# Patient Record
Sex: Male | Born: 1990 | Hispanic: No | Marital: Single | Smoking: Former smoker
Health system: Southern US, Community
[De-identification: ages and names within clinical notes are randomized; demographics above are authoritative.]

## PROBLEM LIST (undated history)

## (undated) DIAGNOSIS — Z8719 Personal history of other diseases of the digestive system: Secondary | ICD-10-CM

## (undated) DIAGNOSIS — N200 Calculus of kidney: Secondary | ICD-10-CM

## (undated) HISTORY — DX: Calculus of kidney: N20.0

## (undated) HISTORY — DX: Personal history of other diseases of the digestive system: Z87.19

---
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
## 2009-08-06 ENCOUNTER — Ambulatory Visit (HOSPITAL_BASED_OUTPATIENT_CLINIC_OR_DEPARTMENT_OTHER): Admission: RE | Admit: 2009-08-06 | Discharge: 2009-08-06 | Payer: Self-pay | Admitting: Specialist

## 2010-03-28 LAB — POCT HEMOGLOBIN-HEMACUE: Hemoglobin: 10.8 g/dL — ABNORMAL LOW (ref 13.0–17.0)

## 2010-04-21 LAB — COMPREHENSIVE METABOLIC PANEL
AST: 25 U/L (ref 0–37)
Albumin: 4 g/dL (ref 3.5–5.2)
BUN: 9 mg/dL (ref 6–23)
Calcium: 9.9 mg/dL (ref 8.4–10.5)
Creatinine, Ser: 0.82 mg/dL (ref 0.4–1.5)
GFR calc Af Amer: >60 mL/min (ref >60)
Total Bilirubin: 1.3 mg/dL — ABNORMAL HIGH (ref 0.3–1.2)

## 2010-04-21 LAB — DIFFERENTIAL
Basophils Absolute: 0.1 10*3/uL (ref 0.0–0.1)
Eosinophils Relative: 2 % (ref 0–5)
Lymphocytes Relative: 14 % (ref 12–46)
Lymphs Abs: 1.7 10*3/uL (ref 0.7–4.0)
Monocytes Absolute: 0.8 10*3/uL (ref 0.1–1.0)
Neutro Abs: 9.8 10*3/uL — ABNORMAL HIGH (ref 1.7–7.7)

## 2010-04-21 LAB — CBC
HCT: 45.1 % (ref 39.0–52.0)
MCHC: 34.2 g/dL (ref 30.0–36.0)
MCV: 86.3 fL (ref 78.0–100.0)
Platelets: 261 10*3/uL (ref 150–400)
RDW: 14.4 % (ref 11.5–15.5)

## 2010-04-21 LAB — TYPE AND SCREEN: Antibody Screen: NEGATIVE

## 2010-04-21 LAB — ABO/RH: ABO/RH(D): A POS

## 2010-04-21 LAB — LIPASE, BLOOD: Lipase: 20 U/L (ref 11–59)

## 2010-05-26 NOTE — Op Note (Signed)
NAMEBRYAN, GOIN                 ACCOUNT NO.:  [id number]   MEDICAL RECORD NO.:  [id number]          PATIENT TYPE:  AMB   LOCATION:  DSC                          FACILITY:  [facility]   PHYSICIAN:  Christopher E. Ezzard Standing, M.D.DATE OF BIRTH:  October 26, 1990   DATE OF PROCEDURE:  08/19/2006  DATE OF DISCHARGE:                               OPERATIVE REPORT   PREOPERATIVE DIAGNOSES:  1. Adenoid tonsillar urgency with obstructive symptoms.  2. History of recurrent tonsillitis.   POSTOPERATIVE DIAGNOSES:  1. Adenoid tonsillar urgency with obstructive symptoms.  2. History of recurrent tonsillitis.   OPERATION:  Tonsillectomy and adenoidectomy.   SURGEON:  Dr. Narda Bonds.   ANESTHESIA:  General endotracheal.   COMPLICATIONS:  None.   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  He is taken to the operating room this time for tonsillectomy  and adenoidectomy.   DESCRIPTION OF PROCEDURE:  After adequate endotracheal anesthesia,  Zayan received 1 gram of Ancef IV preoperatively as well as 10 mg of  Decadron.  A mouth gag was used to expose the oropharynx.  The left and  right tonsils were then resected from the tonsillar fossa using the  cautery.  Care was taken to preserve the anterior and posterior  tonsillar pillars as well as the uvula.  Hemostasis was obtained with  the cautery.  Following this, a red rubber catheter was passed through  the nose and out the mouth to retract the soft palate.  The  nasopharynx  was examined.  Joshus had just a minimal amount of adenoid tissue.  Suction cautery was used to cauterize the central pad of adenoid tissue.  This completed the procedure.  The nose and nasopharynx was irrigated  with saline.  Caylor was awoke from anesthesia and transferred to the  recovery room postop doing well.   DISPOSITION:  Eber was discharged home later this  morning on  amoxicillin suspension 500 mg b.i.d. for 1 week, Tylenol and Lortab  elixir 15 mL q.4 h p.r.n. pain. We will have him follow-up in my office  in 10 days for recheck.          ______________________________  Kristine Garbe. Ezzard Standing, M.D.    CEN/MEDQ  D:  08/19/2006  T:  08/19/2006  Job:  [id number]   cc:   Windle Guard, M.D.
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
## 2010-10-25 ENCOUNTER — Other Ambulatory Visit (INDEPENDENT_AMBULATORY_CARE_PROVIDER_SITE_OTHER): Payer: Self-pay | Admitting: General Surgery

## 2010-10-25 ENCOUNTER — Emergency Department (HOSPITAL_COMMUNITY): Payer: Managed Care, Other (non HMO)

## 2010-10-25 ENCOUNTER — Observation Stay (HOSPITAL_COMMUNITY)
Admission: EM | Admit: 2010-10-25 | Discharge: 2010-10-26 | Disposition: A | Discharge status: Home / Self Care | Payer: Managed Care, Other (non HMO) | Attending: Surgery | Admitting: Surgery

## 2010-10-25 DIAGNOSIS — E669 Obesity, unspecified: Secondary | ICD-10-CM | POA: Insufficient documentation

## 2010-10-25 DIAGNOSIS — F3289 Other specified depressive episodes: Secondary | ICD-10-CM | POA: Insufficient documentation

## 2010-10-25 DIAGNOSIS — F411 Generalized anxiety disorder: Secondary | ICD-10-CM | POA: Insufficient documentation

## 2010-10-25 DIAGNOSIS — R1031 Right lower quadrant pain: Secondary | ICD-10-CM

## 2010-10-25 DIAGNOSIS — K3580 Unspecified acute appendicitis: Secondary | ICD-10-CM

## 2010-10-25 DIAGNOSIS — F329 Major depressive disorder, single episode, unspecified: Secondary | ICD-10-CM | POA: Insufficient documentation

## 2010-10-25 DIAGNOSIS — R110 Nausea: Secondary | ICD-10-CM

## 2010-10-25 HISTORY — PX: APPENDECTOMY: SHX54

## 2010-10-25 LAB — BASIC METABOLIC PANEL
BUN: 15 mg/dL (ref 6–23)
Calcium: 9.4 mg/dL (ref 8.4–10.5)
Chloride: 102 mEq/L (ref 96–112)
Creatinine, Ser: 0.68 mg/dL (ref 0.50–1.35)
Glucose, Bld: 98 mg/dL (ref 70–99)
Potassium: 4.1 mEq/L (ref 3.5–5.1)
Sodium: 138 mEq/L (ref 135–145)

## 2010-10-25 LAB — DIFFERENTIAL
Basophils Absolute: 0.1 10*3/uL (ref 0.0–0.1)
Basophils Relative: 0 % (ref 0–1)
Eosinophils Relative: 4 % (ref 0–5)
Lymphocytes Relative: 22 % (ref 12–46)
Monocytes Absolute: 1.3 10*3/uL — ABNORMAL HIGH (ref 0.1–1.0)
Neutro Abs: 9.3 10*3/uL — ABNORMAL HIGH (ref 1.7–7.7)

## 2010-10-25 LAB — CBC
MCHC: 36.1 g/dL — ABNORMAL HIGH (ref 30.0–36.0)
Platelets: 249 10*3/uL (ref 150–400)
RDW: 13.3 % (ref 11.5–15.5)
WBC: 14.2 10*3/uL — ABNORMAL HIGH (ref 4.0–10.5)

## 2010-10-25 MED ORDER — IOHEXOL 300 MG/ML  SOLN
100.0000 mL | Freq: Once | INTRAMUSCULAR | Status: AC | PRN
  Administered 2010-10-25: 100 mL via INTRAVENOUS

## 2010-10-30 ENCOUNTER — Telehealth (INDEPENDENT_AMBULATORY_CARE_PROVIDER_SITE_OTHER): Payer: Self-pay

## 2010-10-30 DIAGNOSIS — Z9049 Acquired absence of other specified parts of digestive tract: Secondary | ICD-10-CM

## 2010-10-30 MED ORDER — HYDROCODONE-ACETAMINOPHEN 5-325 MG PO TABS: 1.0000 | ORAL_TABLET | Freq: Four times a day (QID) | ORAL | Status: AC | PRN

## 2010-10-30 NOTE — Telephone Encounter (Signed)
Pt requesting refill on Oxycodone. I advised the pt that I could call in the automatic RF for Vicodin. The pt uses the [facility] on [address] [facility]

## 2010-11-10 ENCOUNTER — Ambulatory Visit (INDEPENDENT_AMBULATORY_CARE_PROVIDER_SITE_OTHER): Payer: Managed Care, Other (non HMO) | Admitting: General Surgery

## 2010-11-10 ENCOUNTER — Encounter (INDEPENDENT_AMBULATORY_CARE_PROVIDER_SITE_OTHER): Payer: Self-pay | Admitting: General Surgery

## 2010-11-10 VITALS — BP 128/96 | HR 70 | Temp 96.6°F | Resp 20 | Ht 71.0 in | Wt 286.2 lb

## 2010-11-10 DIAGNOSIS — Z9889 Other specified postprocedural states: Secondary | ICD-10-CM

## 2010-11-10 DIAGNOSIS — K3580 Unspecified acute appendicitis: Secondary | ICD-10-CM

## 2010-11-10 NOTE — Patient Instructions (Signed)
Return as needed

## 2010-11-10 NOTE — Progress Notes (Signed)
Loreto L Tate 08/11/1990 [id number] 11/10/2010   History of Present Illness: OLE LAFON is a  20 y.o. male who presents today status post lap appy.  Pathology reveals acute appendicitis.  The patient is tolerating a regular diet, having normal bowel movements, has good pain control.  He  is back to most normal activities.   Physical Exam: Abd: soft, nontender, active bowel sounds, nondistended.  All incisions are well healed.  Impression: 1.  Acute appendicitis, s/p lap appy  Plan: He  is able to return to normal activities. He  may follow up on a prn basis.

## 2010-11-13 NOTE — Op Note (Signed)
NAMEOLAN, KUREK NO.:  [id number]  MEDICAL RECORD NO.:  [id number]  LOCATION:  5121                         FACILITY:  [facility]  PHYSICIAN:  Mary Sella. Andrey Campanile, MD     DATE OF BIRTH:  09-10-1990  DATE OF PROCEDURE:  10/25/2010 DATE OF DISCHARGE:                              OPERATIVE REPORT   PREOP DIAGNOSIS:  Acute appendicitis.  POSTOP DIAGNOSIS:  Acute appendicitis.  PROCEDURE:  Laparoscopic appendectomy.  SURGEON:  Mary Sella. Andrey Campanile, MD  ASSISTANT SURGEON:  None.  ANESTHESIA:  General plus local consisting 0.25% Marcaine with epi.  FINDINGS:  The appendix was acutely inflamed and thickened, but nonperforated.  SPECIMEN:  Appendix.  ESTIMATED BLOOD LOSS:  Minimal.  DRAINS:  None.  INDICATIONS FOR PROCEDURE:  The patient is a 20 year old obese Caucasian male who developed abdominal pain yesterday evening while at work.  The pain persisted and became localized in his right lower quadrant.  It is associated with nausea.  The pain worsened and prompted him to come to the emergency room for evaluation.  CT scan was performed, which demonstrated acute appendicitis.  His physical exam was also consistent with acute appendicitis.  We discussed the etiology and management of acute appendicitis.  We discussed nonoperative management such as a laparoscopic appendectomy versus IV antibiotics alone.  My formal recommendation was a laparoscopic appendectomy, possible open.  We discussed the risks and benefits of surgery including but not limited to bleeding, infection, injury to surrounding structures, need to convert to an open procedure, blood clot formation, ileus, staple line complications such as bleeding or that staple line leak, ileus formation, incisional hernia as well as general anesthesia complications.  The patient elected to proceed to the operating room.  I did inform him that he would have significant improvement in his symptoms with a  laparoscopic appendectomy.  DESCRIPTION OF PROCEDURE:  After obtaining an informed consent, the patient was brought to the operating room and placed supine on the operating table.  General endotracheal anesthesia was established. Sequential compression devices were placed.  His abdomen was prepped and draped in usual standard surgical fashion.  He received Invanz prior to skin incision.  Surgical time-out was performed.  His abdomen is prepped and draped in usual standard surgical fashion with ChloraPrep.  Local was infiltrated at the base of the umbilicus.  A 1.5 cm vertical infraumbilical incision was made with a #11 blade. The fascia was grasped and lifted anteriorly.  Next, the fascia was incised with a #11 blade and a pursestring suture consisting of 0 Vicryl was placed around the fascial edges.  Next, a 12-mm Hasson trocar was placed and pneumoperitoneum was smoothly established up to a patient pressure of 15 mmHg.  The laparoscope was advanced, there is no evidence of injury to surrounding structures.  The patient was placed in Trendelenburg and rotated to the left.  Two 5 mm trocars were placed, one in the left lower quadrant, one in the suprapubic area, all under direct visualization after local had been infiltrated.  I lifted the omentum out side of the pelvis.  The appendix was readily visible.  It was adhered to the right pelvic  sidewall.  It was inflamed and thickened, and the mesoappendix was inflamed and thickened as well.  I lifted up on the mesoappendix and identified the terminal ileum in the appendiceal base and the cecum.  Then using the Harmonic Scalpel, I took down the mesoappendix in a serial fashion to the base of the appendix taking great care not to injure the surrounding terminal ileum or the cecum.  Once the base was completely mobilized, I took laparoscopic articulating 45 mm stapler with a white load and stapled across the appendiceal base incorporating  some of the cecum.  There is about a 2-mm stalk left of the intestine, therefore I fired another load across that to completely transect the staple line.  The appendix was placed in the specimen bag, which was had been placed through the umbilical trocar.  I then extracted the specimen bag with the appendix and mesoappendix.  The mesoappendix was very thickened and the specimen bag did not come readily out of the abdomen.  I got part of it out and part of it still remained in intraabdominal.  I used a Kelly to pry open the fascial incision a little bit longer, but still it did not work.  The bag actually started to tear a little bit, therefore I incised the fascial incision slightly with an #11 blade.  I then was able to extract the specimen bag.  The entire appendix was removed in its entirety.  I then replaced the Hasson trocar.  I irrigated the right lower quadrant with a liter and half of saline.  There is no evidence of staple line leak. There is one spot of the staple line that was oozing.  I took the hook electrocautery and turned the cautery down to 15 and just gently tapped it with cautery, hemostasis was achieved.  I then evacuated the remaining irrigation fluid return the patient to the midline position and removed the Hasson trocar.  I tied down the previously placed pursestring suture to bring the fascial edges together.  There was still a small little gap, therefore I placed another single interrupted 0 Vicryl suture thus obliterating the fascial defect.  There was nothing within our closure when viewed laparoscopically.  Pneumoperitoneum was released.  I then irrigated the infraumbilical incision with saline since the bag had ripped slightly.  I then closed the skin incisions with 4-0 Monocryl in a subcuticular fashion followed by the application of Dermabond.  The Foley catheter was removed.  All needle, instrument and sponge counts were correct x2.  There are no  immediate complications.  The patient tolerated the procedure well.     Mary Sella. Andrey Campanile, MD     EMW/MEDQ  D:  10/25/2010  T:  10/26/2010  Job:  [id number]  Electronically Signed by Gaynelle Adu M.D. on 11/13/2010 10:22:07 AM

## 2010-11-13 NOTE — H&P (Signed)
Derek Farrell, Derek Farrell NO.:  [id number]  MEDICAL RECORD NO.:  [id number]  LOCATION:  [facility]                         FACILITY:  [facility]  PHYSICIAN:  Mary Sella. Andrey Campanile, MD     DATE OF BIRTH:  December 04, 1990  DATE OF ADMISSION:  10/25/2010 DATE OF DISCHARGE:                             HISTORY & PHYSICAL   ADMITTING SERVICE:  [facility].  CHIEF COMPLAINT:  Abdominal pain.  REASON FOR ADMISSION:  Acute appendicitis.  HISTORY OF PRESENT ILLNESS:  This is a 20 year old obese Caucasian male who developed periumbilical pain yesterday evening while at work.  The pain was initially around his umbilicus and on the left and right side. He had never had any pain like that before.  He was able to finish shift and went home.  Throughout the evening and the night the pain worsened and he actually woke early this morning with worsening pain now in his right lower quadrant.  The pain is constant and sharp.  If he is lying still it does not really bother him that much.  With any movement will aggravate the discomfort.  It has been associated with nausea.  He specifically denies fevers, chills, vomiting, diarrhea, and constipation.  His last bowel movement yesterday evening.  He denies any anorexia.  His last meal was yesterday evening at around 11 p.m.  He denies any melena, hematochezia, or dysuria.  He denies any unplanned weight change.  PAST MEDICAL HISTORY: 1. Anxiety 2. History of major depression and dissociative disorder. 3. ADHD.  PAST SURGICAL HISTORY: 1. Right shoulder arthroscopy 2,  Tonsils and adenoidectomy in 2008.  MEDICATIONS:  Prozac, Abilify, and Diovan.  ALLERGIES:  No known drug allergies.  SOCIAL HISTORY:  Denies any alcohol, drugs, or tobacco.  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  He lives with his grandmother.  REVIEW OF SYSTEMS:  A comprehensive 12-point review of systems was performed, all systems are negative except what is  mentioned above.  FAMILY HISTORY:  Hypertension and obesity.  PHYSICAL EXAMINATION:  VITAL SIGNS:  Temperature 97.8, heart rate 79, blood pressure 140/76, respirations 18, and satting 98% on room air. GENERAL:  Well-developed, well-nourished, obese Caucasian male in no apparent distress. HEENT:  Atraumatic and normocephalic.  Pupils are equal and round.  No scleral icterus.  No external ear lesions.  Hearing grossly normal. NECK:  Supple.  No lymphadenopathy.  Trachea is midline.  PULMONARY: Lungs clear and symmetric. CHEST:  No accessory use of muscles. CARDIOVASCULAR:  Regular rate and rhythm.  2+ radial pulse.  ABDOMEN: Obese, soft.  Positive bowel sounds.  No scars.  He is tender to palpation in his right side and right lower quadrant.  There is no guarding.  There is no rebound tenderness.  No hernias. SKIN:  No jaundice.  No rash.  No edema. NEURO:  Nonfocal.  Sensation grossly intact. PSYCHIATRIC:  Alert and oriented.  Judgment and insight appear appropriate. MUSCULOSKELETAL:  Moves all extremities.  Strength is symmetric.  LABORATORY:  Sodium 138, potassium 4.1, chloride 102, bicarb 25, BUN 15, creatinine 0.68, blood sugar 98, calcium 9.4, white blood cell count 14.2, hemoglobin 16, hematocrit  44, and platelet count 249.  RADIOGRAPHS:  I personally reviewed the CAT scan in addition to reviewing the final report of a CT abdomen and pelvis which showed a dilated appendix with some periappendiceal fat stranding.  There is no evidence of abscess or free air.  There was some small amount of free fluid in the pelvis.  IMPRESSION:  A 20 year old Caucasian male with; 1. Obesity. 2. Attention deficit-hyperactivity disorder. 3. Anxiety. 4. History of major depression. 5. Acute appendicitis.  PLAN:  I have recommended admission to the hospital for his acute appendicitis.  We are going to make him continued to keep him n.p.o. and start IV fluids.  We are going to start  broad-spectrum IV antibiotics. Sequential compression devices will be placed.  We will take him to the operating room later this morning for a laparoscopic appendectomy.  We did discuss the etiology of appendicitis and his management including operative and nonoperative management, we discussed alternative treatments such as IV antibiotics alone versus surgical removal of the appendix.  The patient has elected to proceed to the operating room for laparoscopic appendectomy.  We will continue his home medications.     Mary Sella. Andrey Campanile, MD     EMW/MEDQ  D:  10/25/2010  T:  10/25/2010  Job:  [id number]  Electronically Signed by Gaynelle Adu M.D. on 11/13/2010 10:21:00 AM

## 2011-06-27 ENCOUNTER — Emergency Department (HOSPITAL_COMMUNITY)
Admission: EM | Admit: 2011-06-27 | Discharge: 2011-06-27 | Disposition: A | Discharge status: Home / Self Care | Payer: Managed Care, Other (non HMO) | Attending: Emergency Medicine | Admitting: Emergency Medicine

## 2011-06-27 ENCOUNTER — Encounter (HOSPITAL_COMMUNITY): Payer: Self-pay | Admitting: *Deleted

## 2011-06-27 DIAGNOSIS — F17200 Nicotine dependence, unspecified, uncomplicated: Secondary | ICD-10-CM | POA: Insufficient documentation

## 2011-06-27 DIAGNOSIS — S0180XA Unspecified open wound of other part of head, initial encounter: Secondary | ICD-10-CM | POA: Insufficient documentation

## 2011-06-27 DIAGNOSIS — W1809XA Striking against other object with subsequent fall, initial encounter: Secondary | ICD-10-CM | POA: Insufficient documentation

## 2011-06-27 DIAGNOSIS — S0181XA Laceration without foreign body of other part of head, initial encounter: Secondary | ICD-10-CM

## 2011-06-27 DIAGNOSIS — Y9311 Activity, swimming: Secondary | ICD-10-CM | POA: Insufficient documentation

## 2011-06-27 NOTE — Discharge Instructions (Signed)
Facial Laceration A facial laceration is a cut on the face. It can take 1 to 2 years for the scar to heal completely. HOME CARE  For stitches (sutures):  Keep the cut clean and dry.   If you have a bandage (dressing), change it at least once a day. Change the bandage if it gets wet or dirty, or as told by your doctor.   Wash the cut with soap and water 2 times a day. Rinse the cut with water. Pat it dry with a clean towel.   Put a thin layer of medicated cream on the cut as told by your doctor.   You may shower after the first 24 hours. Do not soak the cut in water until the stitches are removed.   Only take medicines as told by your doctor.   Have your stitches removed as told by your doctor.   Do not wear makeup until a few days after your stitches are removed.  For skin adhesive strips:  Keep the cut clean and dry.   Do not get the strips wet. You may take a bath, but be careful to keep the cut dry.   If the cut gets wet, pat it dry with a clean towel.   The strips will fall off on their own. Do not remove the strips that are still stuck to the cut.  For wound glue:  You may shower or take baths. Do not soak or scrub the cut. Do not swim. Avoid heavy sweating until the glue falls off on its own. After a shower or bath, pat the cut dry with a clean towel.   Do not put medicine or makeup on your cut until the glue falls off.   If you have a bandage, do not put tape over the glue.   Avoid lots of sunlight or tanning lamps until the glue falls off. Put sunscreen on the cut for the first year to reduce your scar.   The glue will fall off on its own. Do not pick at the glue.  You may need a tetanus shot if:  You cannot remember when you had your last tetanus shot.   You have never had a tetanus shot.  If you need a tetanus shot and you choose not to have one, you may get tetanus. Sickness from tetanus can be serious. GET HELP RIGHT AWAY IF:   Your cut gets red, painful,  or puffy (swollen).   There is yellowish-white fluid (pus) coming from the cut.   You have chills or a fever.  MAKE SURE YOU:   Understand these instructions.   Will watch your condition.   Will get help right away if you are not doing well or get worse.  Document Released: 06/16/2007 Document Revised: 12/17/2010 Document Reviewed: 06/23/2010 ExitCare Patient Information 2012 [employer]. 

## 2011-06-27 NOTE — ED Notes (Signed)
Pt states he hit right side of head last night, denies LOC, pt with small laceration above right eyebrow, is here to see if he needs stitches. Denies any headache or blurred vision.

## 2011-06-27 NOTE — ED Provider Notes (Signed)
History  This chart was scribed for Derek Sprout, MD by Derek Farrell. The patient was seen in room STRE4/STRE4. Patient's care was started at 1227.  CSN: [id number]  Arrival date & time 06/27/11  1227   First MD Initiated Contact with Patient 06/27/11 1320      Chief Complaint  Patient presents with  . Facial Injury    (Consider location/radiation/quality/duration/timing/severity/associated sxs/prior treatment) The history is provided by the patient.    Derek Farrell is a 21 y.o. male who presents to the Emergency Department complaining of a small laceration above his right eyelid that occurred 14 hours ago. Pt states that he was swimming with some friends last night when he slipped and hit his head on the ladder. Bleeding has been controlled. Pt reports that his last tetanus shot was 2-3 years ago. Pt denies HA, blurred vision, and syncope.    History reviewed. No pertinent past medical history.  Past Surgical History  Procedure Date  . Appendectomy 10/25/10    History reviewed. No pertinent family history.  History  Substance Use Topics  . Smoking status: Current Some Day Smoker  . Smokeless tobacco: Never Used  . Alcohol Use: Yes      Review of Systems  Constitutional: Negative for fever and chills.  Eyes: Negative for pain and visual disturbance.  Respiratory: Negative for shortness of breath.   Gastrointestinal: Negative for nausea and vomiting.  Skin: Positive for wound (cut above right eye).  Neurological: Negative for syncope, weakness and headaches.  All other systems reviewed and are negative.    Allergies  Review of patient's allergies indicates no known allergies.  Home Medications   Current Outpatient Rx  Name Route Sig Dispense Refill  . FLUOXETINE HCL 20 MG PO CAPS Oral Take 20 mg by mouth.     Marland Kitchen RISPERIDONE 0.5 MG PO TABS Oral Take 0.5 mg by mouth daily.      BP 139/87  Pulse 108  Temp 97.9 F (36.6 C) (Oral)  Resp 17  SpO2  100%  Physical Exam  Nursing note and vitals reviewed. Constitutional: He is oriented to person, place, and time. He appears well-developed and well-nourished. No distress.  HENT:  Head: Normocephalic and atraumatic.  Eyes: EOM are normal. No scleral icterus.  Neck: Neck supple. No tracheal deviation present.  Musculoskeletal: Normal range of motion.  Neurological: He is alert and oriented to person, place, and time.  Skin: Skin is warm and dry.       Curved laceration at lateral aspect of right eyebrow. In first stages of healing and cannot be pulled apart, no orbital tenderness.   Psychiatric: He has a normal mood and affect. His behavior is normal.    ED Course  Procedures (including critical care time)  DIAGNOSTIC STUDIES: Oxygen Saturation is 100% on room air, normal by my interpretation.    COORDINATION OF CARE: 1:45PM - Advised patient that it has already begun healing, and does not need stitches. Pt agrees.     Labs Reviewed - No data to display No results found.   1. Facial laceration       MDM   Patient with a laceration to his face it occurred over 12 hours ago. He was coming into evaluated. He denies any LOC is otherwise feeling normally. Patient's tetanus shot is up-to-date. At this point in time the wound is starting to heal and do not feel he would be of benefit for him to try to. At this time.  I personally performed the services described in this documentation, which was scribed in my presence.  The recorded information has been reviewed and considered.        Derek Sprout, MD 06/27/11 1630

## 2013-07-15 ENCOUNTER — Encounter (HOSPITAL_COMMUNITY): Payer: Self-pay | Admitting: Emergency Medicine

## 2013-07-15 ENCOUNTER — Emergency Department (INDEPENDENT_AMBULATORY_CARE_PROVIDER_SITE_OTHER)
Admission: EM | Admit: 2013-07-15 | Discharge: 2013-07-15 | Disposition: A | Discharge status: Home / Self Care | Payer: BC Managed Care – PPO | Source: Home / Self Care

## 2013-07-15 DIAGNOSIS — J45901 Unspecified asthma with (acute) exacerbation: Secondary | ICD-10-CM

## 2013-07-15 MED ORDER — PREDNISONE 20 MG PO TABS: 60.0000 mg | ORAL_TABLET | Freq: Every day | ORAL | Status: DC

## 2013-07-15 MED ORDER — IPRATROPIUM BROMIDE 0.02 % IN SOLN
RESPIRATORY_TRACT | Status: AC
  Filled 2013-07-15: qty 2.5

## 2013-07-15 MED ORDER — CETIRIZINE-PSEUDOEPHEDRINE ER 5-120 MG PO TB12: 1.0000 | ORAL_TABLET | Freq: Every day | ORAL | Status: DC

## 2013-07-15 MED ORDER — IPRATROPIUM BROMIDE 0.02 % IN SOLN
0.5000 mg | Freq: Once | RESPIRATORY_TRACT | Status: AC
  Administered 2013-07-15: 0.5 mg via RESPIRATORY_TRACT

## 2013-07-15 MED ORDER — ALBUTEROL SULFATE (2.5 MG/3ML) 0.083% IN NEBU
5.0000 mg | INHALATION_SOLUTION | Freq: Once | RESPIRATORY_TRACT | Status: AC
  Administered 2013-07-15: 5 mg via RESPIRATORY_TRACT

## 2013-07-15 MED ORDER — ALBUTEROL SULFATE (2.5 MG/3ML) 0.083% IN NEBU
INHALATION_SOLUTION | RESPIRATORY_TRACT | Status: AC
  Filled 2013-07-15: qty 6

## 2013-07-15 MED ORDER — ALBUTEROL SULFATE HFA 108 (90 BASE) MCG/ACT IN AERS: 1.0000 | INHALATION_SPRAY | Freq: Four times a day (QID) | RESPIRATORY_TRACT | Status: DC | PRN

## 2013-07-15 MED ORDER — FLUTICASONE PROPIONATE 50 MCG/ACT NA SUSP: 2.0000 | Freq: Every day | NASAL | Status: DC

## 2013-07-15 NOTE — Discharge Instructions (Signed)
Asthma, Adult  Asthma is a condition of the lungs in which the airways tighten and narrow. Asthma can make it hard to breathe. Asthma cannot be cured, but medicine and lifestyle changes can help control it. Asthma may be started (triggered) by:  · Animal skin flakes (dander).  · Dust.  · Cockroaches.  · Pollen.  · Mold.  · Smoke.  · Cleaning products.  · Hair sprays or aerosol sprays.  · Paint fumes or strong smells.  · Cold air, weather changes, and winds.  · Crying or laughing hard.  · Stress.  · Certain medicines or drugs.  · Foods, such as dried fruit, potato chips, and sparkling grape juice.  · Infections or conditions (colds, flu).  · Exercise.  · Certain medical conditions or diseases.  · Exercise or tiring activities.  HOME CARE   · Take medicine as told by your doctor.  · Use a peak flow meter as told by your doctor. A peak flow meter is a tool that measures how well the lungs are working.  · Record and keep track of the peak flow meter's readings.  · Understand and use the asthma action plan. An asthma action plan is a written plan for taking care of your asthma and treating your attacks.  · To help prevent asthma attacks:  ¨ Do not smoke. Stay away from secondhand smoke.  ¨ Change your heating and air conditioning filter often.  ¨ Limit your use of fireplaces and wood stoves.  ¨ Get rid of pests (such as roaches and mice) and their droppings.  ¨ Throw away plants if you see mold on them.  ¨ Clean your floors. Dust regularly. Use cleaning products that do not smell.  ¨ Have someone vacuum when you are not home. Use a vacuum cleaner with a HEPA filter if possible.  ¨ Replace carpet with wood, tile, or vinyl flooring. Carpet can trap animal skin flakes and dust.  ¨ Use allergy-proof pillows, mattress covers, and box spring covers.  ¨ Wash bed sheets and blankets every week in hot water and dry them in a dryer.  ¨ Use blankets that are made of polyester or cotton.  ¨ Clean bathrooms and kitchens with bleach.  If possible, have someone repaint the walls in these rooms with mold-resistant paint. Keep out of the rooms that are being cleaned and painted.  ¨ Wash hands often.  GET HELP IF:  · You have make a whistling sound when breaking (wheeze), have shortness of breath, or have a cough even if taking medicine to prevent attacks.  · The colored mucus you cough up (sputum) is thicker than usual.  · The colored mucus you cough up changes from clear or white to yellow, green, gray, or bloody.  · You have problems from the medicine you are taking such as:  ¨ A rash.  ¨ Itching.  ¨ Swelling.  ¨ Trouble breathing.  · You need reliever medicines more than 2-3 times a week.  · Your peak flow measurement is still at 50-79% of your personal best after following the action plan for 1 hour.  GET HELP RIGHT AWAY IF:   · You seem to be worse and are not responding to medicine during an asthma attack.  · You are short of breath even at rest.  · You get short of breath when doing very little activity.  · You have trouble eating, drinking, or talking.  · You have chest pain.  · You have   a fast heartbeat.  · Your lips or fingernails start to turn blue.  · You are lightheaded, dizzy, or faint.  · Your peak flow is less than 50% of your personal best.  · You have a fever or lasting symptoms for more than 2-3 days.  · You have a fever and your symptoms suddenly get worse.  MAKE SURE YOU:   · Understand these instructions.  · Will watch your condition.  · Will get help right away if you are not doing well or get worse.  Document Released: 06/16/2007 Document Revised: 10/18/2012 Document Reviewed: 07/27/2012  ExitCare® Patient Information ©2015 [employer]. This information is not intended to replace advice given to you by your health care provider. Make sure you discuss any questions you have with your health care provider.

## 2013-07-15 NOTE — ED Notes (Signed)
Here w concerned co-worker who insisted he come in to be checked for barking cough since mid March. Reportedly had" green/yellow phlegm  earlier today, now on the clear side". Croupy type cough noted w scattered fine wheeze on ascultation

## 2013-07-15 NOTE — ED Provider Notes (Signed)
CSN: [id number]     Arrival date & time 07/15/13  1455 History   None    Chief Complaint  Patient presents with  . 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 Patient states history of asthma as a child,  but does not take anything or treat for asthma currently. Patient denies any recent illness or nausea, vomiting, or diarrhea.  The patient states he has a central chest pain that is "burning" in nature. It is worse with coughing and reproducible. The patient states has a history of reflux the patient also has a history of seasonal and environmental allergies. Patient states he takes Zyrtec when necessary such as when he gets "really congested".  History reviewed. No pertinent past medical history. Past Surgical History  Procedure Laterality Date  . Appendectomy  10/25/10   History reviewed. No pertinent family history. History  Substance Use Topics  . Smoking status: Current Some Day Smoker  . Smokeless tobacco: Never Used  . Alcohol Use: Yes    Review of Systems  Constitutional: Negative.  Negative for fever, chills and fatigue.  HENT: Negative.  Negative for congestion, ear pain and sore throat.   Eyes: Negative.   Respiratory: Positive for cough and shortness of breath. Negative for chest tightness and wheezing.   Cardiovascular: Positive for chest pain. Negative for palpitations and leg swelling.  Gastrointestinal: Negative.   Endocrine: Negative.   Genitourinary: Negative.   Musculoskeletal: Negative.   Skin: Negative.  Negative for color change and pallor.  Allergic/Immunologic: Positive for environmental allergies. Negative for food allergies and immunocompromised state.  Neurological: Negative.   Hematological: Negative.   Psychiatric/Behavioral:  Negative.     Allergies  Review of patient's allergies indicates no known allergies.  Home Medications   Prior to Admission medications   Medication Sig Start Date End Date Taking? Authorizing Provider  albuterol (PROVENTIL HFA;VENTOLIN HFA) 108 (90 BASE) MCG/ACT inhaler Inhale 1-2 puffs into the lungs every 6 (six) hours as needed for wheezing or shortness of breath. 07/15/13   Weber Cooksatherine Karsyn Rochin, NP  cetirizine-pseudoephedrine (ZYRTEC-D) 5-120 MG per tablet Take 1 tablet by mouth daily. 07/15/13   Weber Cooksatherine Brenan [place], NP  FLUoxetine (PROZAC) 20 MG capsule Take 20 mg by mouth.  10/23/10   Historical Provider, MD  fluticasone (FLONASE) 50 MCG/ACT nasal spray Place 2 sprays into both nostrils daily. 07/15/13   Weber Cooksatherine Bryar Rennie, NP  predniSONE (DELTASONE) 20 MG tablet Take 3 tablets (60 mg total) by mouth daily. 07/15/13   Weber Cooksatherine Breckan Cafiero, NP  risperiDONE (RISPERDAL) 0.5 MG tablet Take 0.5 mg by mouth daily.    Historical Provider, MD   BP 119/75  Pulse 78  Temp(Src) 98.2 F (36.8 C) (Oral)  Resp 18  SpO2 97%  Physical Exam  Nursing note and vitals reviewed. Constitutional: He is oriented to person, place, and time. He appears well-developed and well-nourished. No distress.  Obese.  HENT:  Head: Normocephalic and atraumatic.  Right Ear: External ear normal.  Left Ear: External ear normal.  Mouth/Throat: Oropharynx is clear and moist. No oropharyngeal exudate.  Bilateral tympanic membranes pearly gray in appearance with light reflexes present and bony prominences visualized.  Bilateral nares patent, but nasal turbinates are swollen in appearance.  No tonsils present, removed as a child.  Cobblestone pattern noted in posterior oropharynx.   Eyes: Conjunctivae  are normal. Pupils are equal, round, and reactive to light. Right eye exhibits no discharge. Left eye exhibits no discharge. No scleral icterus.  Neck: Normal range of motion. Neck supple.  Mild anterior cervical lymphadenopathy.   Cardiovascular: Normal rate, regular rhythm, normal heart sounds and intact distal pulses.  Exam reveals no gallop and no friction rub.   No murmur heard. Pulmonary/Chest: Effort normal. No respiratory distress. He has no decreased breath sounds. He has wheezes in the right upper field, the right middle field, the right lower field, the left upper field, the left middle field and the left lower field. He has no rhonchi. He has no rales. He exhibits no tenderness.  Expiratory wheezes heard in all fields. No other adventitious breath sounds noted.    Lymphadenopathy:    He has cervical adenopathy.  Neurological: He is alert and oriented to person, place, and time.  Skin: Skin is warm and dry. No rash noted. He is not diaphoretic. No erythema. No pallor.   She reports significant improvement with handheld nebulizer treatment. Reassessment of lung fields increased air exchange of fields, no wheezing noted on follow up.    ED Course  Procedures (including critical care time) Labs Review Labs Reviewed - No data to display  Imaging Review No results found.   MDM   1. Asthma exacerbation    Meds ordered this encounter  Medications  . albuterol (PROVENTIL) (2.5 MG/3ML) 0.083% nebulizer solution 5 mg    Sig:   . ipratropium (ATROVENT) nebulizer solution 0.5 mg    Sig:   . albuterol (PROVENTIL HFA;VENTOLIN HFA) 108 (90 BASE) MCG/ACT inhaler    Sig: Inhale 1-2 puffs into the lungs every 6 (six) hours as needed for wheezing or shortness of breath.    Dispense:  1 Inhaler    Refill:  3  . fluticasone (FLONASE) 50 MCG/ACT nasal spray    Sig: Place 2 sprays into both nostrils daily.    Dispense:  16 g    Refill:  2  . predniSONE (DELTASONE) 20 MG tablet    Sig: Take 3 tablets (60 mg total) by mouth daily.    Dispense:  15 tablet    Refill:  0  . cetirizine-pseudoephedrine (ZYRTEC-D) 5-120 MG per tablet    Sig: Take 1 tablet by mouth daily.    Dispense:  15 tablet    Refill:  0     The patient verbalizes understanding and agrees to plan of care.   The patient agrees to follow up with his PCP for management of allergies and asthma symptoms.    Weber Cooksatherine Dixie Jafri, NP 07/15/13 [phone]

## 2013-07-16 NOTE — ED Provider Notes (Signed)
Medical screening examination/treatment/procedure(s) were performed by non-physician practitioner and as supervising physician I was immediately available for consultation/collaboration.  Tyreque Finken, M.D.  Wayden Schwertner C Zamyia Gowell, MD 07/16/13 0753 

## 2013-07-19 ENCOUNTER — Other Ambulatory Visit (HOSPITAL_COMMUNITY): Payer: Self-pay | Admitting: Respiratory Therapy

## 2013-07-19 ENCOUNTER — Encounter (HOSPITAL_COMMUNITY): Payer: BC Managed Care – PPO

## 2013-07-19 DIAGNOSIS — R0600 Dyspnea, unspecified: Secondary | ICD-10-CM

## 2013-08-01 ENCOUNTER — Ambulatory Visit (HOSPITAL_COMMUNITY)
Admission: RE | Admit: 2013-08-01 | Discharge: 2013-08-01 | Disposition: A | Discharge status: Home / Self Care | Payer: BC Managed Care – PPO | Source: Ambulatory Visit | Attending: Family Medicine | Admitting: Family Medicine

## 2013-08-01 DIAGNOSIS — R0609 Other forms of dyspnea: Secondary | ICD-10-CM | POA: Insufficient documentation

## 2013-08-01 DIAGNOSIS — R0989 Other specified symptoms and signs involving the circulatory and respiratory systems: Principal | ICD-10-CM | POA: Insufficient documentation

## 2013-08-02 LAB — PULMONARY FUNCTION TEST
FEF 25-75 PRE: 2.74 L/s
FEF2575-%PRED-PRE: 58 %
FEV1-%Pred-Pre: 74 %
FEV1-Pre: 3.35 L
FEV1FVC-%PRED-PRE: 90 %
FEV6-%Pred-Pre: 83 %
FEV6-PRE: 4.46 L
FEV6FVC-%PRED-PRE: 101 %
FVC-%PRED-PRE: 82 %
FVC-PRE: 4.47 L
PRE FEV6/FVC RATIO: 100 %
Pre FEV1/FVC ratio: 75 %

## 2020-04-18 ENCOUNTER — Ambulatory Visit
Admission: EM | Admit: 2020-04-18 | Discharge: 2020-04-18 | Disposition: A | Discharge status: Home / Self Care | Payer: No Typology Code available for payment source

## 2020-04-18 ENCOUNTER — Encounter: Payer: Self-pay | Admitting: Emergency Medicine

## 2020-04-18 ENCOUNTER — Other Ambulatory Visit: Payer: Self-pay

## 2020-04-18 ENCOUNTER — Ambulatory Visit (INDEPENDENT_AMBULATORY_CARE_PROVIDER_SITE_OTHER): Payer: No Typology Code available for payment source

## 2020-04-18 ENCOUNTER — Ambulatory Visit: Admit: 2020-04-18 | Discharge status: Absent without leave | Payer: Self-pay | Source: Home / Self Care

## 2020-04-18 DIAGNOSIS — M533 Sacrococcygeal disorders, not elsewhere classified: Secondary | ICD-10-CM

## 2020-04-18 DIAGNOSIS — S300XXA Contusion of lower back and pelvis, initial encounter: Secondary | ICD-10-CM

## 2020-04-18 DIAGNOSIS — S20222A Contusion of left back wall of thorax, initial encounter: Secondary | ICD-10-CM

## 2020-04-18 DIAGNOSIS — M545 Low back pain, unspecified: Secondary | ICD-10-CM | POA: Diagnosis not present

## 2020-04-18 DIAGNOSIS — W19XXXA Unspecified fall, initial encounter: Secondary | ICD-10-CM | POA: Diagnosis not present

## 2020-04-18 MED ORDER — HYDROCODONE-ACETAMINOPHEN 5-325 MG PO TABS: 1.0000 | ORAL_TABLET | Freq: Four times a day (QID) | ORAL | 0 refills | Status: DC | PRN

## 2020-04-18 MED ORDER — METHOCARBAMOL 500 MG PO TABS: ORAL_TABLET | ORAL | 0 refills | Status: AC

## 2020-04-18 NOTE — ED Triage Notes (Signed)
Fell backward yesterday now has lower back pain more pain on left side.

## 2020-04-18 NOTE — Discharge Instructions (Addendum)
Ice areas of pain for 20 minutes 3-5 times a day for 48h Sit on a doughnut cushion til the pain gets better

## 2020-04-18 NOTE — ED Provider Notes (Signed)
[facility]    CSN: [id number] Arrival date & time: 04/18/20  1836      History   Chief Complaint No chief complaint on file.   HPI Derek Farrell is a 30 y.o. male who presents due to having L back pain after falling backwards yesterday.  He was placing a TV up and fell onto the couch edge hitting his L mid lumbar area and then landed on the floor hitting his tail bone. Has pain sitting down and has to lean forward. 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 Will need a work note. Denies pain radiating to legs or paresthesia.   History reviewed. No pertinent past medical history.  There are no problems to display for this patient.   Past Surgical History:  Procedure Laterality Date  . APPENDECTOMY  10/25/10       Home Medications    Prior to Admission medications   Medication Sig Start Date End Date Taking? Authorizing Provider  citalopram (CELEXA) 40 MG tablet Take 40 mg by mouth daily.   Yes [provider]  HYDROcodone-acetaminophen (NORCO/VICODIN) 5-325 MG tablet Take 1 tablet by mouth every 6 (six) hours as needed. 04/18/20  Yes Rodriguez-Southworth, Nettie Elm, PA-C  hydrOXYzine (ATARAX/VISTARIL) 25 MG tablet Take 25 mg by mouth 3 (three) times daily as needed.   Yes [provider]  methocarbamol (ROBAXIN) 500 MG tablet 1-2 q 8h prn muscle spasm 04/18/20  Yes Rodriguez-Southworth, Nettie Elm, PA-C  albuterol (PROVENTIL HFA;VENTOLIN HFA) 108 (90 BASE) MCG/ACT inhaler Inhale 1-2 puffs into the lungs every 6 (six) hours as needed for wheezing or shortness of breath. 07/15/13 04/18/20  Servando Salina, NP  FLUoxetine (PROZAC) 20 MG capsule Take 20 mg by mouth.  10/23/10 04/18/20  [provider]  fluticasone (FLONASE) 50 MCG/ACT nasal spray Place 2 sprays into both nostrils daily. 07/15/13 04/18/20  Servando Salina, NP  risperiDONE (RISPERDAL) 0.5 MG tablet Take 0.5 mg by mouth daily.  04/18/20  [provider]     Family History History reviewed. No pertinent family history.  Social History Social History   Tobacco Use  . Smoking status: Current Some Day Smoker  . Smokeless tobacco: Never Used  Substance Use Topics  . Alcohol use: Yes  . Drug use: No     Allergies   Patient has no known allergies.   Review of Systems Review of Systems  Gastrointestinal: Negative for abdominal pain.  Musculoskeletal: Positive for back pain and myalgias. Negative for arthralgias, gait problem and joint swelling.  Skin: Negative for color change, pallor, rash and wound.  Neurological: Negative for numbness.     Physical Exam Triage Vital Signs ED Triage Vitals  Enc Vitals Group     BP 04/18/20 1846 120/83     Pulse Rate 04/18/20 1846 97     Resp 04/18/20 1846 18     Temp 04/18/20 1846 98.4 F (36.9 C)     Temp Source 04/18/20 1846 Oral     SpO2 04/18/20 1846 94 %     Weight --      Height --      Head Circumference --      Peak Flow --      Pain Score 04/18/20 1847 6     Pain Loc --      Pain Edu? --      Excl. in GC? --    No data found.  Updated Vital Signs  BP 120/83 (BP Location: Right Arm)   Pulse 97   Temp 98.4 F (36.9 C) (Oral)   Resp 18   SpO2 94%   Visual Acuity Right Eye Distance:   Left Eye Distance:   Bilateral Distance:    Right Eye Near:   Left Eye Near:    Bilateral Near:     Physical Exam Constitutional:      General: He is not in acute distress.    Appearance: He is obese. He is not toxic-appearing.  HENT:     Head: Normocephalic.     Right Ear: External ear normal.     Left Ear: External ear normal.  Eyes:     General: No scleral icterus.    Conjunctiva/sclera: Conjunctivae normal.  Pulmonary:     Effort: Pulmonary effort is normal.  Musculoskeletal:        General: Normal range of motion.     Cervical back: Neck supple.     Comments: BACK- has local tenderness on lower lumbar spine area and tail bone as well as soft tissue of L mid lumbar  region. There are no swelling or ecchymosis  Skin:    General: Skin is warm and dry.     Findings: No bruising, erythema or rash.  Neurological:     Mental Status: He is alert and oriented to person, place, and time.     Gait: Gait normal.  Psychiatric:        Mood and Affect: Mood normal.        Behavior: Behavior normal.        Judgment: Judgment normal.      UC Treatments / Results  Labs (all labs ordered are listed, but only abnormal results are displayed) Labs Reviewed - No data to display  EKG   Radiology DG Lumbar Spine Complete  Result Date: 04/18/2020 CLINICAL DATA:  Lower lumbar pain after fall yesterday. EXAM: LUMBAR SPINE - COMPLETE 4+ VIEW COMPARISON:  None. FINDINGS: Mild broad-based levo scoliotic curvature of the lumbar spine. No listhesis. No evidence of fracture. Evaluation of the posterior elements is limited due to soft tissue attenuation from habitus. Vertebral body heights are normal. Mild L5-S1 disc space narrowing. IMPRESSION: 1. No fracture or subluxation of the lumbar spine. 2. Mild broad-based levo scoliotic curvature. 3. Mild L5-S1 disc space narrowing. Electronically Signed   By: Narda Rutherford M.D.   On: 04/18/2020 19:33   DG Sacrum/Coccyx  Result Date: 04/18/2020 CLINICAL DATA:  Fall yesterday with lumbar and sacral pain. EXAM: SACRUM AND COCCYX - 2+ VIEW COMPARISON:  None. FINDINGS: Lateral view unable to be obtained due to patient's size and condition. This limits detailed assessment. No evidence of sacrococcygeal fracture in AP views. The sacroiliac joints are congruent. IMPRESSION: Negative AP radiographs of the sacrum and coccyx. Electronically Signed   By: Narda Rutherford M.D.   On: 04/18/2020 19:32    Procedures Procedures (including critical care time)  Medications Ordered in UC Medications - No data to display  Initial Impression / Assessment and Plan / UC Course  I have reviewed the triage vital signs and the nursing notes. Pertinent   imaging results that were available during my care of the patient were reviewed by me and considered in my medical decision making (see chart for details). Some views could not be done due to pt's weight. This was explained to and if pain worsens needs to FU with PCP or ER to have more view done.  I placed him  on norco and robaxin as noted  which he has tolerated fine in the past. See instructions.    Final Clinical Impressions(s) / UC Diagnoses   Final diagnoses:  Contusion of left side of back, initial encounter  Coccyx contusion, initial encounter     Discharge Instructions     Ice areas of pain for 20 minutes 3-5 times a day for 48h Sit on a doughnut cushion til the pain gets better     ED Prescriptions    Medication Sig Dispense Auth. Provider   HYDROcodone-acetaminophen (NORCO/VICODIN) 5-325 MG tablet Take 1 tablet by mouth every 6 (six) hours as needed. 15 tablet Rodriguez-Southworth, Sahily Biddle, PA-C   methocarbamol (ROBAXIN) 500 MG tablet 1-2 q 8h prn muscle spasm 30 tablet Rodriguez-Southworth, Nettie Elm, PA-C     I have reviewed the PDMP during this encounter.   Garey Ham, Cordelia Poche 04/18/20 2043

## 2020-04-22 ENCOUNTER — Encounter (HOSPITAL_BASED_OUTPATIENT_CLINIC_OR_DEPARTMENT_OTHER): Payer: Self-pay

## 2020-04-22 ENCOUNTER — Ambulatory Visit (HOSPITAL_BASED_OUTPATIENT_CLINIC_OR_DEPARTMENT_OTHER): Payer: No Typology Code available for payment source | Admitting: Nurse Practitioner

## 2020-04-30 ENCOUNTER — Encounter: Payer: Self-pay | Admitting: Family Medicine

## 2020-04-30 ENCOUNTER — Other Ambulatory Visit: Payer: Self-pay

## 2020-04-30 ENCOUNTER — Emergency Department (HOSPITAL_COMMUNITY)
Admission: EM | Admit: 2020-04-30 | Discharge: 2020-04-30 | Disposition: A | Discharge status: Home / Self Care | Payer: No Typology Code available for payment source | Attending: Emergency Medicine | Admitting: Emergency Medicine

## 2020-04-30 ENCOUNTER — Emergency Department (HOSPITAL_COMMUNITY): Payer: No Typology Code available for payment source

## 2020-04-30 ENCOUNTER — Encounter (HOSPITAL_COMMUNITY): Payer: Self-pay | Admitting: Emergency Medicine

## 2020-04-30 ENCOUNTER — Ambulatory Visit (INDEPENDENT_AMBULATORY_CARE_PROVIDER_SITE_OTHER): Payer: No Typology Code available for payment source | Admitting: Family Medicine

## 2020-04-30 VITALS — BP 112/74 | HR 79 | Temp 97.1°F | Ht 70.0 in | Wt 367.6 lb

## 2020-04-30 DIAGNOSIS — N131 Hydronephrosis with ureteral stricture, not elsewhere classified: Secondary | ICD-10-CM | POA: Diagnosis not present

## 2020-04-30 DIAGNOSIS — K529 Noninfective gastroenteritis and colitis, unspecified: Secondary | ICD-10-CM

## 2020-04-30 DIAGNOSIS — R635 Abnormal weight gain: Secondary | ICD-10-CM

## 2020-04-30 DIAGNOSIS — Z1159 Encounter for screening for other viral diseases: Secondary | ICD-10-CM

## 2020-04-30 DIAGNOSIS — F324 Major depressive disorder, single episode, in partial remission: Secondary | ICD-10-CM

## 2020-04-30 DIAGNOSIS — Z87442 Personal history of urinary calculi: Secondary | ICD-10-CM | POA: Insufficient documentation

## 2020-04-30 DIAGNOSIS — Z13220 Encounter for screening for lipoid disorders: Secondary | ICD-10-CM

## 2020-04-30 DIAGNOSIS — R319 Hematuria, unspecified: Secondary | ICD-10-CM

## 2020-04-30 DIAGNOSIS — Z87891 Personal history of nicotine dependence: Secondary | ICD-10-CM | POA: Diagnosis not present

## 2020-04-30 DIAGNOSIS — R109 Unspecified abdominal pain: Secondary | ICD-10-CM | POA: Diagnosis present

## 2020-04-30 DIAGNOSIS — N201 Calculus of ureter: Secondary | ICD-10-CM

## 2020-04-30 DIAGNOSIS — Z202 Contact with and (suspected) exposure to infections with a predominantly sexual mode of transmission: Secondary | ICD-10-CM

## 2020-04-30 LAB — URINALYSIS, ROUTINE W REFLEX MICROSCOPIC
Bilirubin Urine: NEGATIVE
Glucose, UA: NEGATIVE mg/dL
Ketones, ur: NEGATIVE mg/dL
Leukocytes,Ua: NEGATIVE
Nitrite: NEGATIVE
Protein, ur: 100 mg/dL — AB
RBC / HPF: >50 RBC/hpf — ABNORMAL HIGH (ref 0–5)
Specific Gravity, Urine: 1.024 (ref 1.005–1.030)
pH: 6 (ref 5.0–8.0)

## 2020-04-30 LAB — CBC WITH DIFFERENTIAL/PLATELET
Abs Immature Granulocytes: 0.08 10*3/uL — ABNORMAL HIGH (ref 0.00–0.07)
Basophils Absolute: 0.1 10*3/uL (ref 0.0–0.1)
Basophils Relative: 1 %
Eosinophils Absolute: 0.3 10*3/uL (ref 0.0–0.5)
Eosinophils Relative: 3 %
HCT: 47 % (ref 39.0–52.0)
Hemoglobin: 15.1 g/dL (ref 13.0–17.0)
Immature Granulocytes: 1 %
Lymphocytes Relative: 31 %
Lymphs Abs: 3.3 10*3/uL (ref 0.7–4.0)
MCH: 29.8 pg (ref 26.0–34.0)
MCHC: 32.1 g/dL (ref 30.0–36.0)
MCV: 92.9 fL (ref 80.0–100.0)
Monocytes Absolute: 0.8 10*3/uL (ref 0.1–1.0)
Monocytes Relative: 7 %
Neutro Abs: 6.3 10*3/uL (ref 1.7–7.7)
Neutrophils Relative %: 57 %
Platelets: 305 10*3/uL (ref 150–400)
RBC: 5.06 MIL/uL (ref 4.22–5.81)
RDW: 13.3 % (ref 11.5–15.5)
WBC: 10.9 10*3/uL — ABNORMAL HIGH (ref 4.0–10.5)
nRBC: 0 % (ref 0.0–0.2)

## 2020-04-30 LAB — BASIC METABOLIC PANEL
Anion gap: 9 (ref 5–15)
BUN: 9 mg/dL (ref 6–20)
CO2: 26 mmol/L (ref 22–32)
Calcium: 9.3 mg/dL (ref 8.9–10.3)
Chloride: 103 mmol/L (ref 98–111)
Creatinine, Ser: 0.93 mg/dL (ref 0.61–1.24)
GFR, Estimated: >60 mL/min (ref >60)
Glucose, Bld: 113 mg/dL — ABNORMAL HIGH (ref 70–99)
Potassium: 3.8 mmol/L (ref 3.5–5.1)
Sodium: 138 mmol/L (ref 135–145)

## 2020-04-30 MED ORDER — OXYCODONE-ACETAMINOPHEN 5-325 MG PO TABS: 1.0000 | ORAL_TABLET | Freq: Four times a day (QID) | ORAL | 0 refills | Status: AC | PRN

## 2020-04-30 MED ORDER — HYDROXYZINE HCL 25 MG PO TABS: 25.0000 mg | ORAL_TABLET | Freq: Three times a day (TID) | ORAL | 1 refills | Status: DC | PRN

## 2020-04-30 MED ORDER — CITALOPRAM HYDROBROMIDE 40 MG PO TABS: 40.0000 mg | ORAL_TABLET | Freq: Every day | ORAL | 3 refills | Status: DC

## 2020-04-30 MED ORDER — TAMSULOSIN HCL 0.4 MG PO CAPS: 0.4000 mg | ORAL_CAPSULE | Freq: Every day | ORAL | 0 refills | Status: AC

## 2020-04-30 MED ORDER — OXYCODONE-ACETAMINOPHEN 5-325 MG PO TABS
1.0000 | ORAL_TABLET | Freq: Once | ORAL | Status: AC
Start: 2020-04-30 — End: 2020-04-30
  Administered 2020-04-30: 1 via ORAL
  Filled 2020-04-30: qty 1

## 2020-04-30 NOTE — ED Provider Notes (Signed)
[facility] EMERGENCY DEPARTMENT Provider Note   CSN: [id number] Arrival date & time: 04/30/20  1556     History No chief complaint on file.   Derek Farrell is a 30 y.o. male with a history of kidney stones and previous appendectomy.  Patient presents today with chief plaint of left flank pain.  Patient reports flank pain radiates to his left groin.  Patient states that his pain began today after seeing his primary care provider this afternoon.  Patient rates his pain 7/10 pain scale.  Patient reports pain is worse when moving or touching his affected flank.  Patient reports improvement when he shifts his body weight off of his left flank.  Patient endorses hematuria over the last 24 hours as well as nausea.  Patient denies any fever, chills, abdominal pain, vomiting, diarrhea, dysuria, urinary frequency, urinary hesitancy, penile discharge, swelling to genitals, tenderness to genitals.    HPI     Past Medical History:  Diagnosis Date  . History of GI disease    childhood  . Kidney stones    Childhood    There are no problems to display for this patient.   Past Surgical History:  Procedure Laterality Date  . APPENDECTOMY  10/25/10       Family History  Problem Relation Age of Onset  . Hypertension Father   . Diabetes Father   . Intellectual disability Paternal Grandmother   . Hypothyroidism Neg Hx     Social History   Tobacco Use  . Smoking status: Former Smoker    Types: Cigarettes    Quit date: 01/12/2019    Years since quitting: 1.2  . Smokeless tobacco: Never Used  Vaping Use  . Vaping Use: Never used  Substance Use Topics  . Alcohol use: Yes    Alcohol/week: 2.0 standard drinks    Types: 2 Glasses of wine per week  . Drug use: No    Home Medications Prior to Admission medications   Medication Sig Start Date End Date Taking? Authorizing Provider  citalopram (CELEXA) 40 MG tablet Take 1 tablet (40 mg total) by mouth daily. 04/30/20    Everrett Coombe, DO  hydrOXYzine (ATARAX/VISTARIL) 25 MG tablet Take 1 tablet (25 mg total) by mouth 3 (three) times daily as needed. 04/30/20   Everrett Coombe, DO  methocarbamol (ROBAXIN) 500 MG tablet 1-2 q 8h prn muscle spasm 04/18/20   Rodriguez-Southworth, Nettie Elm, PA-C  albuterol (PROVENTIL HFA;VENTOLIN HFA) 108 (90 BASE) MCG/ACT inhaler Inhale 1-2 puffs into the lungs every 6 (six) hours as needed for wheezing or shortness of breath. 07/15/13 04/18/20  Servando Salina, NP  FLUoxetine (PROZAC) 20 MG capsule Take 20 mg by mouth.  10/23/10 04/18/20  [provider]  fluticasone (FLONASE) 50 MCG/ACT nasal spray Place 2 sprays into both nostrils daily. 07/15/13 04/18/20  Servando Salina, NP  risperiDONE (RISPERDAL) 0.5 MG tablet Take 0.5 mg by mouth daily.  04/18/20  [provider]    Allergies    Patient has no known allergies.  Review of Systems   Review of Systems  Constitutional: Negative for chills and fever.  Eyes: Negative for visual disturbance.  Respiratory: Negative for shortness of breath.   Cardiovascular: Negative for chest pain.  Gastrointestinal: Positive for nausea. Negative for abdominal distention, abdominal pain, anal bleeding, blood in stool, constipation, diarrhea, rectal pain and vomiting.  Genitourinary: Positive for flank pain and hematuria. Negative for difficulty urinating, dysuria, frequency, penile discharge, penile pain, penile swelling, scrotal  swelling, testicular pain and urgency.  Musculoskeletal: Negative for back pain and neck pain.  Skin: Negative for color change and rash.  Neurological: Negative for dizziness, syncope, light-headedness and headaches.  Psychiatric/Behavioral: Negative for confusion.    Physical Exam Updated Vital Signs BP (!) 159/108   Pulse 95   Temp 98 F (36.7 C) (Oral)   Resp 20   SpO2 100%   Physical Exam Vitals and nursing note reviewed.  Constitutional:      General: He is not in acute distress.     Appearance: He is morbidly obese. He is not ill-appearing, toxic-appearing or diaphoretic.  HENT:     Head: Normocephalic.  Eyes:     General: No scleral icterus.       Right eye: No discharge.        Left eye: No discharge.  Cardiovascular:     Rate and Rhythm: Normal rate.  Pulmonary:     Effort: Pulmonary effort is normal. No tachypnea, bradypnea or respiratory distress.     Breath sounds: No stridor.  Abdominal:     General: Abdomen is protuberant. Bowel sounds are normal. There is no distension. There are no signs of injury.     Palpations: There is no mass or pulsatile mass.     Tenderness: There is abdominal tenderness in the left upper quadrant. There is left CVA tenderness. There is no right CVA tenderness, guarding or rebound.     Hernia: There is no hernia in the umbilical area or ventral area.     Comments: Exam hindered by patient's body habitus  Musculoskeletal:     Cervical back: Normal range of motion and neck supple. No rigidity.  Skin:    General: Skin is warm and dry.  Neurological:     General: No focal deficit present.     Mental Status: He is alert.  Psychiatric:        Behavior: Behavior is cooperative.     ED Results / Procedures / Treatments   Labs (all labs ordered are listed, but only abnormal results are displayed) Labs Reviewed  URINALYSIS, ROUTINE W REFLEX MICROSCOPIC - Abnormal; Notable for the following components:      Result Value   Color, Urine AMBER (*)    APPearance CLOUDY (*)    Hgb urine dipstick LARGE (*)    Protein, ur 100 (*)    RBC / HPF >50 (*)    Bacteria, UA RARE (*)    Non Squamous Epithelial 0-5 (*)    All other components within normal limits  BASIC METABOLIC PANEL - Abnormal; Notable for the following components:   Glucose, Bld 113 (*)    All other components within normal limits  CBC WITH DIFFERENTIAL/PLATELET - Abnormal; Notable for the following components:   WBC 10.9 (*)    Abs Immature Granulocytes 0.08 (*)     All other components within normal limits  URINE CULTURE    EKG None  Radiology CT Renal Stone Study  Result Date: 04/30/2020 CLINICAL DATA:  30 year old male with left flank pain. EXAM: CT ABDOMEN AND PELVIS WITHOUT CONTRAST TECHNIQUE: Multidetector CT imaging of the abdomen and pelvis was performed following the standard protocol without IV contrast. COMPARISON:  CT abdomen pelvis dated 10/24/2020. FINDINGS: Evaluation of this exam is limited in the absence of intravenous contrast. Lower chest: The visualized lung bases are clear. No intra-abdominal free air or free fluid. Hepatobiliary: Diffuse fatty liver. No intrahepatic biliary dilatation. The gallbladder is unremarkable Pancreas: Unremarkable. No  pancreatic ductal dilatation or surrounding inflammatory changes. Spleen: Normal in size without focal abnormality. Adrenals/Urinary Tract: The adrenal glands unremarkable. There is a 4 mm stone in the proximal left ureter with mild left hydronephrosis. The right kidney is unremarkable. The urinary bladder is predominantly collapsed. Stomach/Bowel: There is no bowel obstruction or active inflammation. Appendectomy. Vascular/Lymphatic: The abdominal aorta and IVC are grossly unremarkable on noncontrast CT. No portal venous gas. There is no adenopathy. Reproductive: The prostate and seminal vesicles are grossly unremarkable. No pelvic mass Other: None Musculoskeletal: No acute or significant osseous findings. IMPRESSION: 1. A 4 mm proximal left ureteral stone with mild left hydronephrosis. 2. Fatty liver. Electronically Signed   By: Elgie Collard M.D.   On: 04/30/2020 16:58    Procedures Procedures   Medications Ordered in ED Medications  oxyCODONE-acetaminophen (PERCOCET/ROXICET) 5-325 MG per tablet 1 tablet (1 tablet Oral Given 04/30/20 1707)    ED Course  I have reviewed the triage vital signs and the nursing notes.  Pertinent labs & imaging results that were available during my care of the  patient were reviewed by me and considered in my medical decision making (see chart for details).    MDM Rules/Calculators/A&P                          Alert 30 year old male no acute distress, nontoxic-appearing.  Patient presents with chief complaint of left flank pain and hematuria.  Patient reports left flank pain radiates down to his groin.  On physical exam patient's abdomen is soft, nondistended, tenderness to left upper quadrant and flank.  Positive left CVA tenderness.  Patient is afebrile.  BMP, CBC, urinalysis, renal study obtained while patient was in triage.  CBC shows slight leukocytosis at 10.9. BMP is unremarkable. Urinalysis shows RBC greater than 50, bacteria rare, leukocytes negative, nitrite negative.  Low suspicion for urinary tract infection as patient is afebrile without any complaints of dysuria, urinary frequency, urinary urgency as well as nitrate and leukocytes being negative.  We will send urine for culture.  CT renal study shows 4 mm proximal left ureteral stone with mild left hydronephrosis.   Based on its size ureteral calculi likely to pass spontaneously.  We will start patient on tamsulosin once daily.  Will prescribe patient Percocet for pain management.  We will have patient follow-up with urology in outpatient setting.  Discussed results, findings, treatment and follow up. Patient advised of return precautions. Patient verbalized understanding and agreed with plan.   Final Clinical Impression(s) / ED Diagnoses Final diagnoses:  Ureteral calculi    Rx / DC Orders ED Discharge Orders         Ordered    tamsulosin (FLOMAX) 0.4 MG CAPS capsule  Daily        04/30/20 1818    oxyCODONE-acetaminophen (PERCOCET/ROXICET) 5-325 MG tablet  Every 6 hours PRN        04/30/20 1818           Berneice Heinrich 04/30/20 2159    Tegeler, Canary Brim, MD 05/01/20 [phone]

## 2020-04-30 NOTE — Assessment & Plan Note (Signed)
Checking UA and STD panel.

## 2020-04-30 NOTE — ED Notes (Signed)
RN reviewed discharge instructions w/ pt. Follow up, pain management, and prescriptions reviewed. Pt had no further questions 

## 2020-04-30 NOTE — ED Triage Notes (Signed)
Pt here from home with c/o left side /flank pain that radiates down to his groin

## 2020-04-30 NOTE — Discharge Instructions (Addendum)
You came to the emergency room today to be evaluated for left flank pain.  Your CT scan showed that you have a left-sided kidney stone.  Patient also has a stone should pass on its own.  I have given you Percocet, pain medicine,  please take 1 pill as needed every 6 hours for severe pain.  I have also given prescription for Flomax.  Please take 1 pill once daily. Please follow-up with urology.  Get help right away if: You have a fever or chills. You develop severe pain. You develop new abdominal pain. You faint. You are unable to urinate.

## 2020-04-30 NOTE — Progress Notes (Signed)
Derek Farrell - 30 y.o. male MRN [id number]  Date of birth: [birth year]  Subjective Chief Complaint  Patient presents with  . Establish Care    HPI Derek Farrell is a 30 y.o. male here today for initial visit to establish care.  He has several concerns today.    He has history of depression and anxiety currently managed with citalopram and hydroxyzine.  He requests referral to psychiatry but needs refills until he is seen for this appt.    He has also had issues with diarrhea.  This has been a recurrent issue for several months.  He has tried to make some changes to his diet without much improvement.  He has not noted any particular foods that make this worse.  He has noted some blood in his stool at times but has had some irritated hemorrhoids.  He requests referral to GI.    He has noted some blood in his urine recently.  He has had kidney stones previously but denies pain at this time.  No dysuria or frequency. He would like updated STI screening.    He is also concerned about weight gain.  He has put on about 100lbs over the past couple of years.  Would like referral to Healthy Weight and wellness.   ROS:  A comprehensive ROS was completed and negative except as noted per HPI   No Known Allergies  Past Medical History:  Diagnosis Date  . History of GI disease    childhood  . Kidney stones    Childhood    Past Surgical History:  Procedure Laterality Date  . APPENDECTOMY  10/25/10    Social History   Socioeconomic History  . Marital status: Single    Spouse name: Not on file  . Number of children: Not on file  . Years of education: Not on file  . Highest education level: Not on file  Occupational History  . Not on file  Tobacco Use  . Smoking status: Former Smoker    Types: Cigarettes    Quit date: 01/12/2019    Years since quitting: 1.2  . Smokeless tobacco: Never Used  Vaping Use  . Vaping Use: Never used  Substance and Sexual Activity  . Alcohol use: Yes     Alcohol/week: 2.0 standard drinks    Types: 2 Glasses of wine per week  . Drug use: No  . Sexual activity: Yes    Partners: Male    Birth control/protection: Condom  Other Topics Concern  . Not on file  Social History Narrative  . Not on file   Social Determinants of Health   Financial Resource Strain: Not on file  Food Insecurity: Not on file  Transportation Needs: Not on file  Physical Activity: Not on file  Stress: Not on file  Social Connections: Not on file    Family History  Problem Relation Age of Onset  . Hypertension Father   . Diabetes Father   . Intellectual disability Paternal Grandmother   . Hypothyroidism Neg Hx     Health Maintenance  Topic Date Due  . Hepatitis C Screening  Never done  . HIV Screening  Never done  . COVID-19 Vaccine (3 - Booster for Pfizer series) 12/13/2019  . INFLUENZA VACCINE  08/11/2020  . TETANUS/TDAP  11/11/2029  . HPV VACCINES  Aged Out     ----------------------------------------------------------------------------------------------------------------------------------------------------------------------------------------------------------------- Physical Exam BP 112/74 (BP Location: Left Arm, Patient Position: Sitting, Cuff Size: Large)   Pulse 79  Temp (!) 97.1 F (36.2 C)   Ht 5\' 10"  (1.778 m)   Wt (!) 367 lb 9.6 oz (166.7 kg)   SpO2 97%   BMI 52.75 kg/m   Physical Exam Constitutional:      Appearance: Normal appearance.  HENT:     Head: Normocephalic and atraumatic.  Eyes:     General: No scleral icterus. Cardiovascular:     Rate and Rhythm: Normal rate and regular rhythm.  Pulmonary:     Effort: Pulmonary effort is normal.     Breath sounds: Normal breath sounds.  Abdominal:     General: Abdomen is flat. There is no distension.     Palpations: Abdomen is soft.     Tenderness: There is no abdominal tenderness.  Musculoskeletal:     Cervical back: Neck supple.  Neurological:     General: No focal  deficit present.     Mental Status: He is alert.  Psychiatric:        Mood and Affect: Mood normal.        Behavior: Behavior normal.     ------------------------------------------------------------------------------------------------------------------------------------------------------------------------------------------------------------------- Assessment and Plan  Chronic diarrhea Checking CMP, CBC and TSH.  Recommend increased fiber intake.  Given low FODMAP handout.   Referral made to GI for chronic diarrhea and hemorrhoids.  Abnormal weight gain Discussed dietary changes.  Check TSH and a1c Referral placed to healthy weight and wellness.   Major depressive disorder with single episode, in partial remission (HCC) Stable with citalopram and hydroxyzine.  Referral placed to psychiatry  Possible exposure to STD STD screening ordered  See lab orders.   Hematuria Checking UA and STD panel.    Meds ordered this encounter  Medications  . hydrOXYzine (ATARAX/VISTARIL) 25 MG tablet    Sig: Take 1 tablet (25 mg total) by mouth 3 (three) times daily as needed.    Dispense:  90 tablet    Refill:  1  . citalopram (CELEXA) 40 MG tablet    Sig: Take 1 tablet (40 mg total) by mouth daily.    Dispense:  30 tablet    Refill:  3    No follow-ups on file.    This visit occurred during the SARS-CoV-2 public health emergency.  Safety protocols were in place, including screening questions prior to the visit, additional usage of staff PPE, and extensive cleaning of exam room while observing appropriate contact time as indicated for disinfecting solutions.

## 2020-04-30 NOTE — Assessment & Plan Note (Signed)
Checking CMP, CBC and TSH.  Recommend increased fiber intake.  Given low FODMAP handout.   Referral made to GI for chronic diarrhea and hemorrhoids.

## 2020-04-30 NOTE — ED Triage Notes (Signed)
Emergency Medicine Provider Triage Evaluation Note  Derek Farrell , a 30 y.o. male  was evaluated in triage.  Pt complains of left flank pain and hematuria.  She went for a physical with his PCP today and also noted some blood in his urine this morning but was not having any pain at this time, shortly after he left the PCP office he started having a dull pain in his left side that has since worsened and is now radiating toward his groin and left testicle.  He has not noticed any testicular swelling.  Remote history of kidney stones, but is not sure if this feels similar.  Review of Systems  Positive: Flank pain, hematuria, nausea Negative: Vomiting, chest pain  Physical Exam  BP (!) 159/108   Pulse 95   Temp 98 F (36.7 C) (Oral)   Resp 20   SpO2 100%  Gen:   Awake, no distress   HEENT:  Atraumatic  Resp:  Normal effort  Cardiac:  Normal rate  Abd:   Nondistended, some tenderness over the left flank and left lower quadrant MSK:   Moves extremities without difficulty  Neuro:  Speech clear   Medical Decision Making  Medically screening exam initiated at 3:59 PM.  Appropriate orders placed.  Derek Farrell was informed that the remainder of the evaluation will be completed by another provider, this initial triage assessment does not replace that evaluation, and the importance of remaining in the ED until their evaluation is complete.  Suspect kidney stone, labs and renal CT ordered.  Clinical Impression  1. Left Flank Pain 2. Hematuria   Dartha Lodge, [place] 04/30/20 1606

## 2020-04-30 NOTE — Assessment & Plan Note (Signed)
Stable with citalopram and hydroxyzine.  Referral placed to psychiatry

## 2020-04-30 NOTE — Assessment & Plan Note (Signed)
Discussed dietary changes.  Check TSH and a1c Referral placed to healthy weight and wellness.

## 2020-04-30 NOTE — Assessment & Plan Note (Signed)
STD screening ordered  See lab orders.

## 2020-04-30 NOTE — Patient Instructions (Signed)
Great to meet you! Have labs completed We'll be in touch with results.  Try increasing your fiber intake to help with diarrhea.  You can try adding benefiber daily.    Low-FODMAP Eating Plan  FODMAP stands for fermentable oligosaccharides, disaccharides, monosaccharides, and polyols. These are sugars that are hard for some people to digest. A low-FODMAP eating plan may help some people who have irritable bowel syndrome (IBS) and certain other bowel (intestinal) diseases to manage their symptoms. This meal plan can be complicated to follow. Work with a diet and nutrition specialist (dietitian) to make a low-FODMAP eating plan that is right for you. A dietitian can help make sure that you get enough nutrition from this diet. What are tips for following this plan? Reading food labels  Check labels for hidden FODMAPs such as: ? High-fructose syrup. ? Honey. ? Agave. ? Natural fruit flavors. ? Onion or garlic powder.  Choose low-FODMAP foods that contain 3-4 grams of fiber per serving.  Check food labels for serving sizes. Eat only one serving at a time to make sure FODMAP levels stay low. Shopping  Shop with a list of foods that are recommended on this diet and make a meal plan. Meal planning  Follow a low-FODMAP eating plan for up to 6 weeks, or as told by your health care provider or dietitian.  To follow the eating plan: 1. Eliminate high-FODMAP foods from your diet completely. Choose only low-FODMAP foods to eat. You will do this for 2-6 weeks. 2. Gradually reintroduce high-FODMAP foods into your diet one at a time. Most people should wait a few days before introducing the next new high-FODMAP food into their meal plan. Your dietitian can recommend how quickly you may reintroduce foods. 3. Keep a daily record of what and how much you eat and drink. Make note of any symptoms that you have after eating. 4. Review your daily record with a dietitian regularly to identify which foods you  can eat and which foods you should avoid. General tips  Drink enough fluid each day to keep your urine pale yellow.  Avoid processed foods. These often have added sugar and may be high in FODMAPs.  Avoid most dairy products, whole grains, and sweeteners.  Work with a dietitian to make sure you get enough fiber in your diet.  Avoid high FODMAP foods at meals to manage symptoms. Recommended foods Fruits Bananas, oranges, tangerines, lemons, limes, blueberries, raspberries, strawberries, grapes, cantaloupe, honeydew melon, kiwi, papaya, passion fruit, and pineapple. Limited amounts of dried cranberries, banana chips, and shredded coconut. Vegetables Eggplant, zucchini, cucumber, peppers, green beans, bean sprouts, lettuce, arugula, kale, Swiss chard, spinach, collard greens, bok choy, summer squash, potato, and tomato. Limited amounts of corn, carrot, and sweet potato. Green parts of scallions. Grains Gluten-free grains, such as rice, oats, buckwheat, quinoa, corn, polenta, and millet. Gluten-free pasta, bread, or cereal. Rice noodles. Corn tortillas. Meats and other proteins Unseasoned beef, pork, poultry, or fish. Eggs. Tomasa Blase. Tofu (firm) and tempeh. Limited amounts of nuts and seeds, such as almonds, walnuts, [place] nuts, pecans, peanuts, nut butters, pumpkin seeds, chia seeds, and sunflower seeds. Dairy Lactose-free milk, yogurt, and kefir. Lactose-free cottage cheese and ice cream. Non-dairy milks, such as almond, coconut, hemp, and rice milk. Non-dairy yogurt. Limited amounts of goat cheese, brie, mozzarella, parmesan, swiss, and other hard cheeses. Fats and oils Butter-free spreads. Vegetable oils, such as olive, canola, and sunflower oil. Seasoning and other foods Artificial sweeteners with names that do not end in "  ol," such as aspartame, saccharine, and stevia. Maple syrup, white table sugar, raw sugar, brown sugar, and molasses. Mayonnaise, soy sauce, and tamari. Fresh basil,  coriander, parsley, rosemary, and thyme. Beverages Water and mineral water. Sugar-sweetened soft drinks. Small amounts of orange juice or cranberry juice. Black and green tea. Most dry wines. Coffee. The items listed above may not be a complete list of foods and beverages you can eat. Contact a dietitian for more information. Foods to avoid Fruits Fresh, dried, and juiced forms of apple, pear, watermelon, peach, plum, cherries, apricots, blackberries, boysenberries, figs, nectarines, and mango. Avocado. Vegetables Chicory root, artichoke, asparagus, cabbage, snow peas, Brussels sprouts, broccoli, sugar snap peas, mushrooms, celery, and cauliflower. Onions, garlic, leeks, and the white part of scallions. Grains Wheat, including kamut, durum, and semolina. Barley and bulgur. Couscous. Wheat-based cereals. Wheat noodles, bread, crackers, and pastries. Meats and other proteins Fried or fatty meat. Sausage. Cashews and pistachios. Soybeans, baked beans, black beans, chickpeas, kidney beans, fava beans, navy beans, lentils, black-eyed peas, and split peas. Dairy Milk, yogurt, ice cream, and soft cheese. Cream and sour cream. Milk-based sauces. Custard. Buttermilk. Soy milk. Seasoning and other foods Any sugar-free gum or candy. Foods that contain artificial sweeteners such as sorbitol, mannitol, isomalt, or xylitol. Foods that contain honey, high-fructose corn syrup, or agave. Bouillon, vegetable stock, beef stock, and chicken stock. Garlic and onion powder. Condiments made with onion, such as hummus, chutney, pickles, relish, salad dressing, and salsa. Tomato paste. Beverages Chicory-based drinks. Coffee substitutes. Chamomile tea. Fennel tea. Sweet or fortified wines such as port or sherry. Diet soft drinks made with isomalt, mannitol, maltitol, sorbitol, or xylitol. Apple, pear, and mango juice. Juices with high-fructose corn syrup. The items listed above may not be a complete list of foods and  beverages you should avoid. Contact a dietitian for more information. Summary  FODMAP stands for fermentable oligosaccharides, disaccharides, monosaccharides, and polyols. These are sugars that are hard for some people to digest.  A low-FODMAP eating plan is a short-term diet that helps to ease symptoms of certain bowel diseases.  The eating plan usually lasts up to 6 weeks. After that, high-FODMAP foods are reintroduced gradually and one at a time. This can help you find out which foods may be causing symptoms.  A low-FODMAP eating plan can be complicated. It is best to work with a dietitian who has experience with this type of plan. This information is not intended to replace advice given to you by your health care provider. Make sure you discuss any questions you have with your health care provider. Document Revised: 05/17/2019 Document Reviewed: 05/17/2019 Elsevier Patient Education  2021 [employer].

## 2020-05-01 ENCOUNTER — Encounter: Payer: Self-pay | Admitting: Family Medicine

## 2020-05-02 LAB — URINE CULTURE: Culture: <10000 — AB

## 2020-05-05 ENCOUNTER — Other Ambulatory Visit: Payer: Self-pay | Admitting: Family Medicine

## 2020-05-05 MED ORDER — DOXYCYCLINE HYCLATE 100 MG PO TABS: 100.0000 mg | ORAL_TABLET | Freq: Two times a day (BID) | ORAL | 0 refills | Status: AC

## 2020-05-06 LAB — LIPID PANEL W/REFLEX DIRECT LDL
Cholesterol: 160 mg/dL (ref <200)
HDL: 36 mg/dL — ABNORMAL LOW (ref >=40)
LDL Cholesterol (Calc): 106 mg/dL (calc) — ABNORMAL HIGH
Non-HDL Cholesterol (Calc): 124 mg/dL (calc) (ref <130)
Total CHOL/HDL Ratio: 4.4 (calc) (ref <5.0)
Triglycerides: 86 mg/dL (ref <150)

## 2020-05-06 LAB — RPR: RPR Ser Ql: NONREACTIVE

## 2020-05-06 LAB — URINALYSIS
Bilirubin Urine: NEGATIVE
Glucose, UA: NEGATIVE
Ketones, ur: NEGATIVE
Nitrite: NEGATIVE
Specific Gravity, Urine: 1.02 (ref 1.001–1.035)
pH: 5.5 (ref 5.0–8.0)

## 2020-05-06 LAB — COMPLETE METABOLIC PANEL WITH GFR
AG Ratio: 1.5 (calc) (ref 1.0–2.5)
ALT: 47 U/L — ABNORMAL HIGH (ref 9–46)
AST: 33 U/L (ref 10–40)
Albumin: 4.1 g/dL (ref 3.6–5.1)
Alkaline phosphatase (APISO): 59 U/L (ref 36–130)
BUN: 8 mg/dL (ref 7–25)
CO2: 28 mmol/L (ref 20–32)
Calcium: 9.1 mg/dL (ref 8.6–10.3)
Chloride: 104 mmol/L (ref 98–110)
Creat: 0.61 mg/dL (ref 0.60–1.35)
GFR, Est African American: 155 mL/min/{1.73_m2} (ref >=60)
GFR, Est Non African American: 134 mL/min/{1.73_m2} (ref >=60)
Globulin: 2.8 g/dL (calc) (ref 1.9–3.7)
Glucose, Bld: 88 mg/dL (ref 65–99)
Potassium: 4.2 mmol/L (ref 3.5–5.3)
Sodium: 139 mmol/L (ref 135–146)
Total Bilirubin: 1.1 mg/dL (ref 0.2–1.2)
Total Protein: 6.9 g/dL (ref 6.1–8.1)

## 2020-05-06 LAB — HEPATITIS C ANTIBODY
Hepatitis C Ab: NONREACTIVE
SIGNAL TO CUT-OFF: 0.02 (ref <1.00)

## 2020-05-06 LAB — CBC WITH DIFFERENTIAL/PLATELET
Absolute Monocytes: 608 cells/uL (ref 200–950)
Basophils Absolute: 86 cells/uL (ref 0–200)
Basophils Relative: 0.9 %
Eosinophils Absolute: 304 cells/uL (ref 15–500)
Eosinophils Relative: 3.2 %
HCT: 44.2 % (ref 38.5–50.0)
Hemoglobin: 15 g/dL (ref 13.2–17.1)
Lymphs Abs: 3107 cells/uL (ref 850–3900)
MCH: 30.4 pg (ref 27.0–33.0)
MCHC: 33.9 g/dL (ref 32.0–36.0)
MCV: 89.5 fL (ref 80.0–100.0)
MPV: 9.9 fL (ref 7.5–12.5)
Monocytes Relative: 6.4 %
Neutro Abs: 5396 cells/uL (ref 1500–7800)
Neutrophils Relative %: 56.8 %
Platelets: 269 10*3/uL (ref 140–400)
RBC: 4.94 10*6/uL (ref 4.20–5.80)
RDW: 12.9 % (ref 11.0–15.0)
Total Lymphocyte: 32.7 %
WBC: 9.5 10*3/uL (ref 3.8–10.8)

## 2020-05-06 LAB — URINE CULTURE
MICRO NUMBER:: 11792313
Result:: NO GROWTH
SPECIMEN QUALITY:: ADEQUATE

## 2020-05-06 LAB — HIV ANTIBODY (ROUTINE TESTING W REFLEX): HIV 1&2 Ab, 4th Generation: NONREACTIVE

## 2020-05-06 LAB — TSH+FREE T4: TSH W/REFLEX TO FT4: 1.13 mIU/L (ref 0.40–4.50)

## 2020-05-06 LAB — CHLAMYDIA/NEISSERIA GONORRHOEAE RNA,TMA,UROGENTIAL
C. trachomatis RNA, TMA: DETECTED — AB
N. gonorrhoeae RNA, TMA: NOT DETECTED

## 2020-05-06 LAB — CHLAMYDIA TRACHOMATIS,TMA(ALTERNATE TARGET),UROGENTIAL: Chlamydia Trachomatis,TMA(ALT Target),Urogenital: DETECTED — AB

## 2020-05-06 LAB — HEMOGLOBIN A1C
Hgb A1c MFr Bld: 5.8 % of total Hgb — ABNORMAL HIGH (ref <5.7)
Mean Plasma Glucose: 120 mg/dL
eAG (mmol/L): 6.6 mmol/L

## 2020-06-17 ENCOUNTER — Telehealth: Payer: No Typology Code available for payment source | Admitting: Physician Assistant

## 2020-06-17 DIAGNOSIS — M5416 Radiculopathy, lumbar region: Secondary | ICD-10-CM

## 2020-06-17 DIAGNOSIS — M549 Dorsalgia, unspecified: Secondary | ICD-10-CM

## 2020-06-18 NOTE — Progress Notes (Signed)
Based on what you shared with me, I feel your condition warrants further evaluation and I recommend that you be seen in a face to face office visit. Giving chronicity and severity of your symptoms along with symptoms concerning for nerve compression (numbness, tingling, weakness, etc) you need to be see in-person to get the appropriate evaluation to make sure the proper treatments can be given.    NOTE: If you entered your credit card information for this eVisit, you will not be charged. You may see a "hold" on your card for the $35 but that hold will drop off and you will not have a charge processed.   If you are having a true medical emergency please call 911.      For an urgent face to face visit, [place] has six urgent care centers for your convenience:     [facility] at [facility] Directions [phone] [address] [place], [place] [address] . 8 am - 4 pm Monday - Friday    [facility]) Get Driving Directions [phone] [address] [place], [place] [address] . 8 am to 8 pm Monday-Friday . 10 am to 6 pm [facility] [place] [facility]) Get Driving Directions [phone]  [address] [place],  [place]  [address] . 8 am to 8 pm Monday-Friday . 8 am to 4 pm [facility] at [facility] Get Driving Directions [phone] [address] [place], [place] [address] . 8 am to 8 pm Monday-Friday . 8 am to 4 pm [facility] at [facility] Get Driving Directions  [phone] [address] [place], [place] [address] . 8 am to 8 pm Monday-Friday . 8 am to 4 pm [facility] at [facility] Directions [phone] [address] [place], [place] [address] . 8 am to 8 pm Monday-Friday . 8 am to  4 pm Saturday-Sunday     Your MyChart E-visit questionnaire answers were reviewed by a board certified advanced clinical practitioner to complete your personal care plan based on your specific symptoms.  Thank you for using e-Visits.

## 2020-06-25 ENCOUNTER — Other Ambulatory Visit: Payer: Self-pay | Admitting: Family Medicine

## 2020-08-01 ENCOUNTER — Other Ambulatory Visit: Payer: Self-pay | Admitting: Family Medicine

## 2020-09-08 ENCOUNTER — Other Ambulatory Visit: Payer: Self-pay | Admitting: Family Medicine

## 2020-11-21 ENCOUNTER — Encounter: Payer: Self-pay | Admitting: Family Medicine

## 2020-11-24 MED ORDER — HYDROXYZINE HCL 25 MG PO TABS: 25.0000 mg | ORAL_TABLET | Freq: Three times a day (TID) | ORAL | 1 refills | Status: DC | PRN

## 2020-11-24 MED ORDER — CITALOPRAM HYDROBROMIDE 40 MG PO TABS: ORAL_TABLET | ORAL | 2 refills | Status: AC

## 2021-02-11 ENCOUNTER — Other Ambulatory Visit: Payer: Self-pay | Admitting: Family Medicine

## 2021-02-12 NOTE — Telephone Encounter (Signed)
Pls contact pt to schedule appt for follow-up with Dr. Ashley Royalty.   Not seen since 04/2020. Thanks

## 2021-02-12 NOTE — Telephone Encounter (Signed)
Contacted pt and left a message telling him he needs to schedule an appt in order to get any further refills.

## 2022-05-14 IMAGING — CT CT RENAL STONE PROTOCOL
2 of 4 series · 17 of 46 positions shown, 19 images · non-contrast
Comparison: CT abdomen pelvis dated 10/24/2020.

CLINICAL DATA: 29-year-old male with left flank pain.

EXAM:
CT ABDOMEN AND PELVIS WITHOUT CONTRAST
TECHNIQUE: Multidetector CT imaging of the abdomen and pelvis was performed
following the standard protocol without IV contrast.

[Series 3: ap without · axial · non-contrast · 0.97mm/px · z∈[+743,+1198]mm · 14 of 105 slices shown, 16 images]
[im 7/105  soft-tissue]
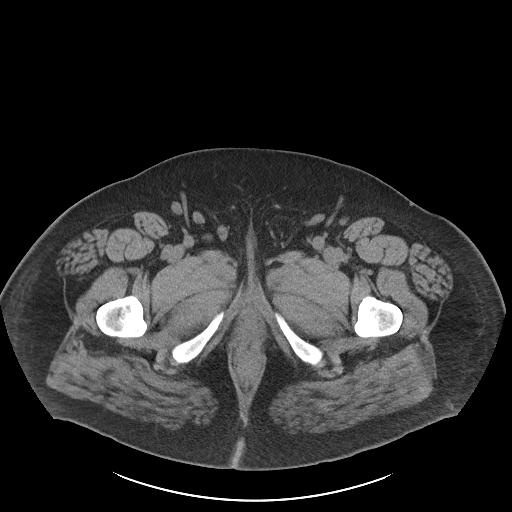
[im 7/105  bone]
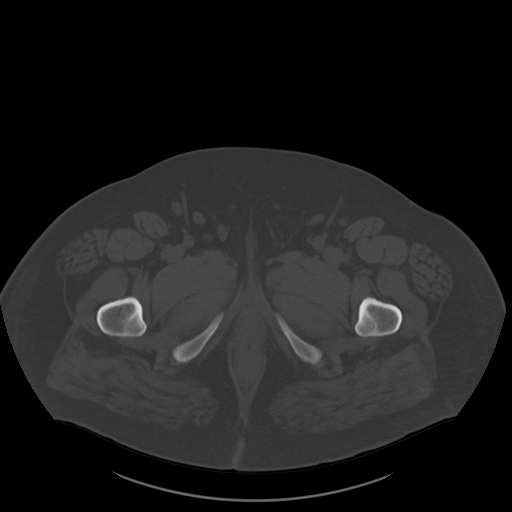
[im 13/105  soft-tissue]
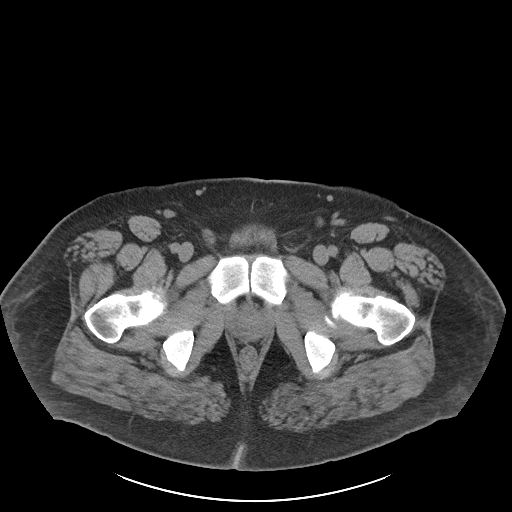
[im 19/105  soft-tissue]
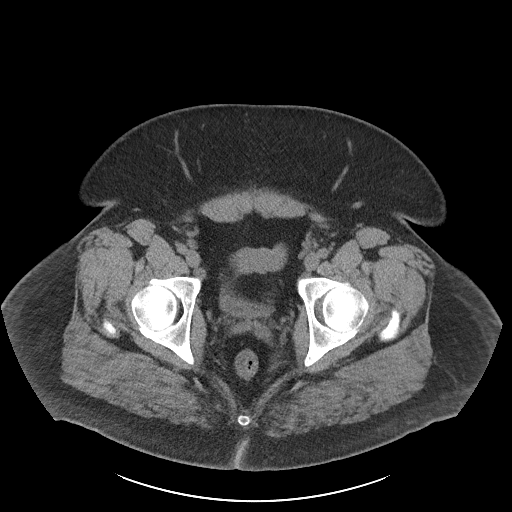
[im 31/105  soft-tissue]
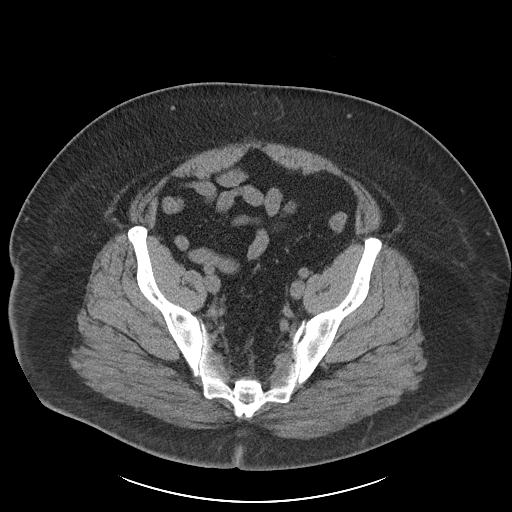
[im 37/105  soft-tissue]
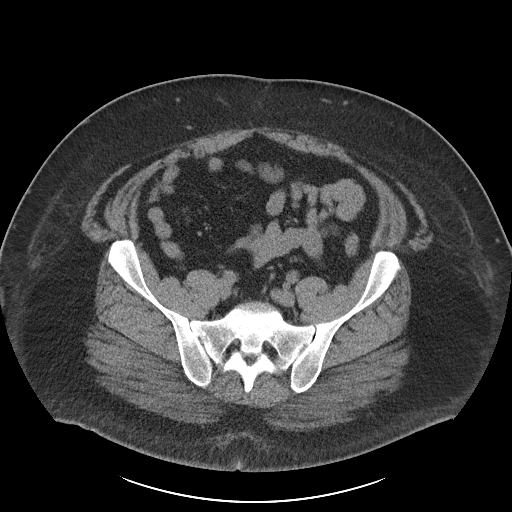
[im 43/105  soft-tissue]
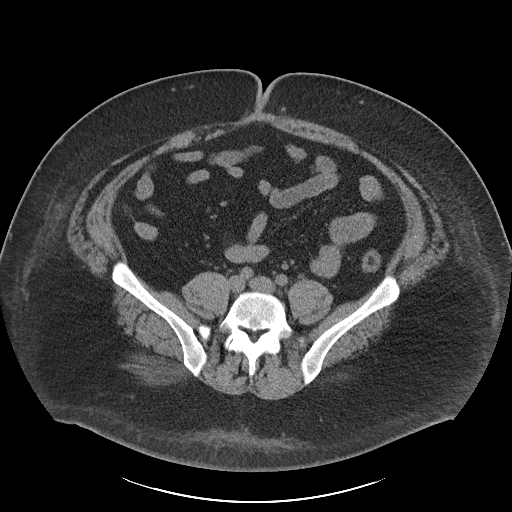
[im 49/105  soft-tissue]
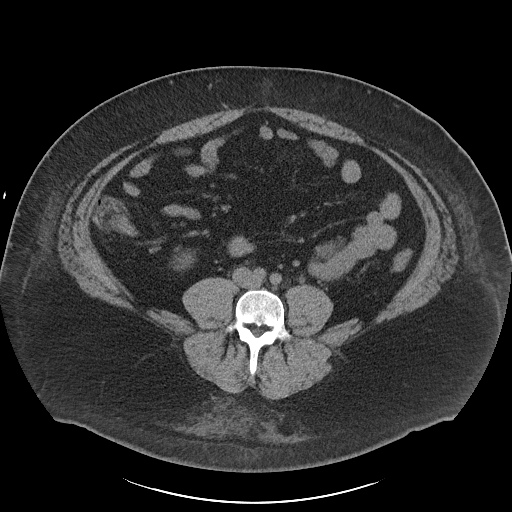
[im 56/105  soft-tissue]
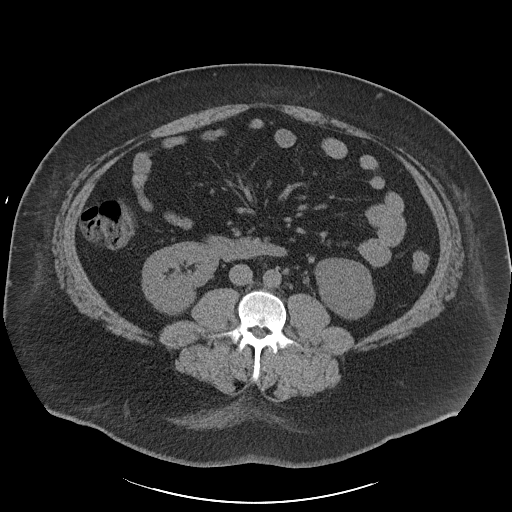
[im 62/105  soft-tissue]
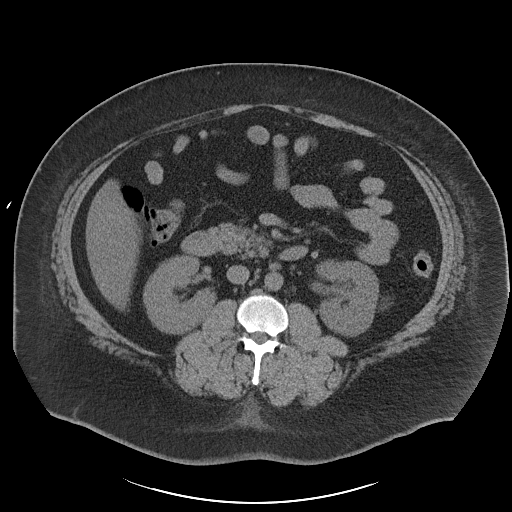
[im 62/105  bone]
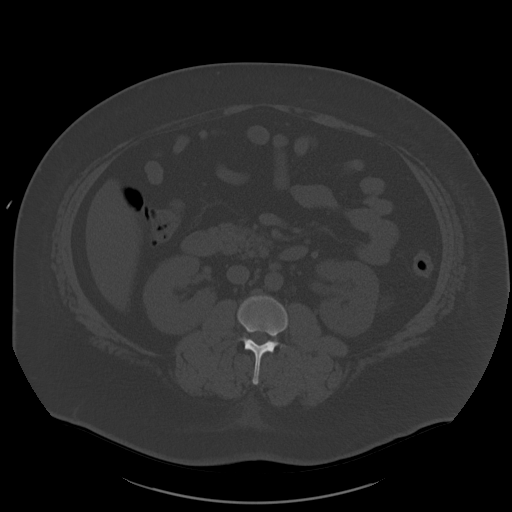
[im 68/105  soft-tissue]
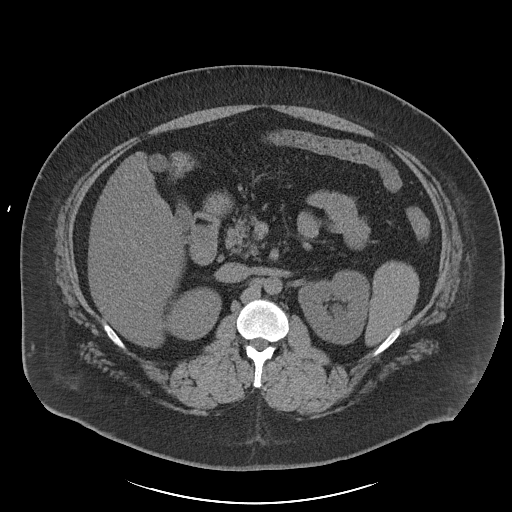
[im 80/105  soft-tissue]
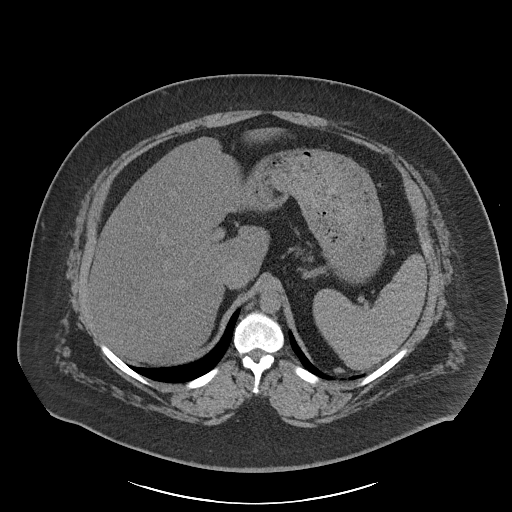
[im 86/105  soft-tissue]
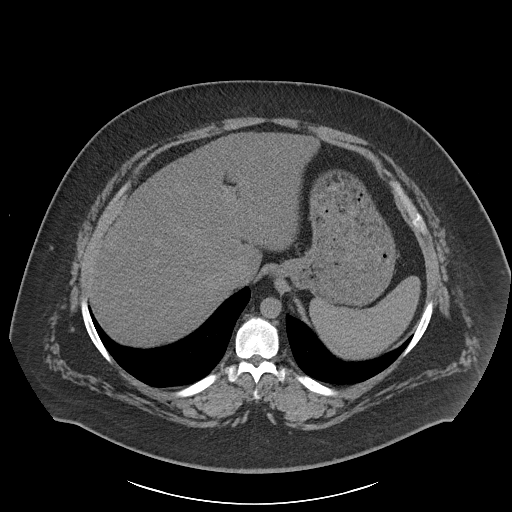
[im 92/105  soft-tissue]
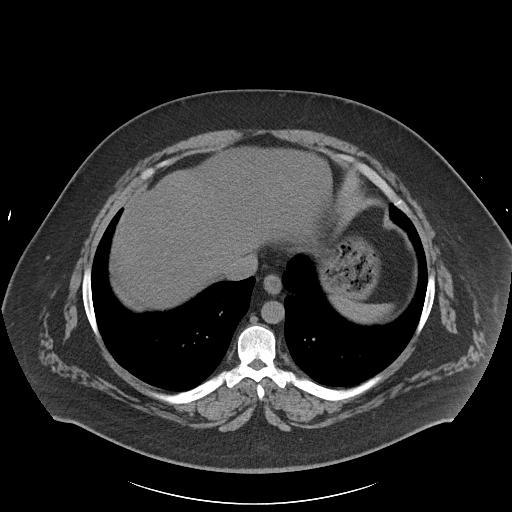
[im 98/105  soft-tissue]
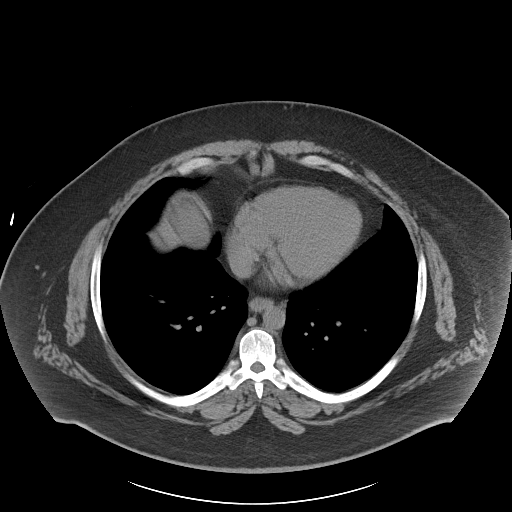

[Series 6: cor · coronal · 0.98mm/px · 3 of 141 slices shown]
[im 47/141  soft-tissue]
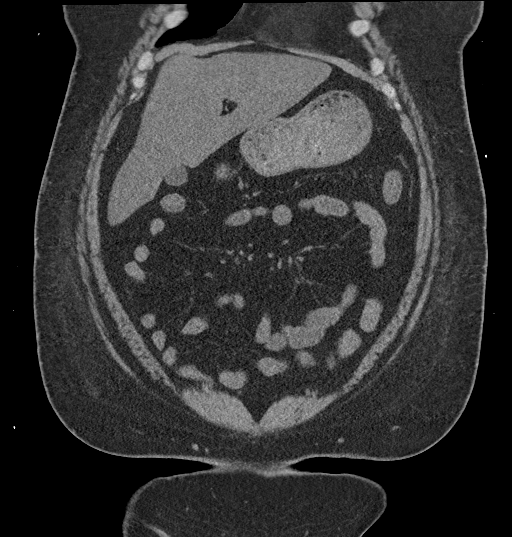
[im 63/141  soft-tissue]
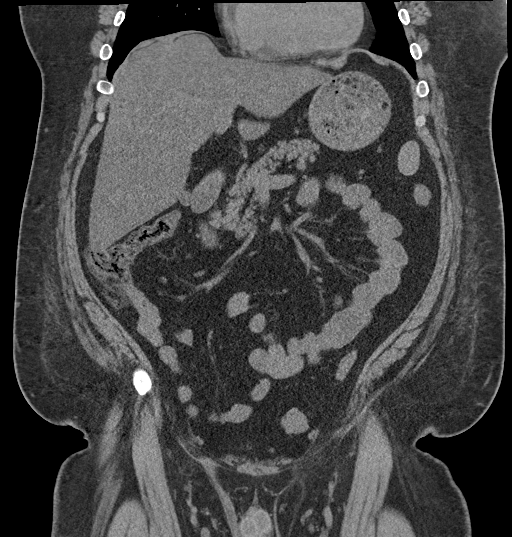
[im 78/141  soft-tissue]
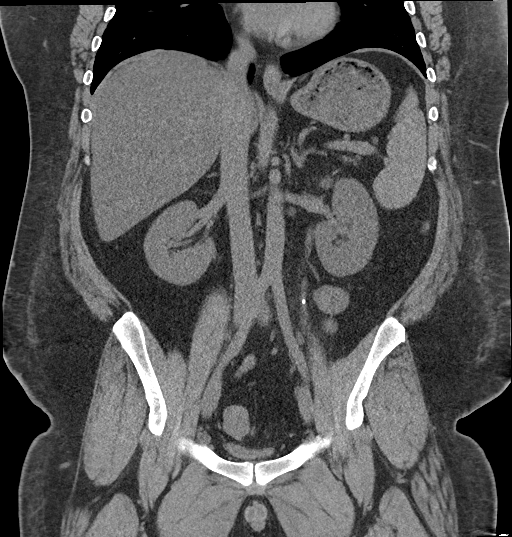

[17 of 46 positions shown; findings below may reference images not displayed]

FINDINGS: Evaluation of this exam is limited in the absence of intravenous
contrast.

Lower chest: The visualized lung bases are clear.

No intra-abdominal free air or free fluid.

Hepatobiliary: Diffuse fatty liver. No intrahepatic biliary
dilatation. The gallbladder is unremarkable

Pancreas: Unremarkable. No pancreatic ductal dilatation or
surrounding inflammatory changes.

Spleen: Normal in size without focal abnormality.

Adrenals/Urinary Tract: The adrenal glands unremarkable. There is a
4 mm stone in the proximal left ureter with mild left
hydronephrosis. The right kidney is unremarkable. The urinary
bladder is predominantly collapsed.

Stomach/Bowel: There is no bowel obstruction or active inflammation.
Appendectomy.

Vascular/Lymphatic: The abdominal aorta and IVC are grossly
unremarkable on noncontrast CT. No portal venous gas. There is no
adenopathy.

Reproductive: The prostate and seminal vesicles are grossly
unremarkable. No pelvic mass

Other: None

Musculoskeletal: No acute or significant osseous findings.
IMPRESSION: 1. A 4 mm proximal left ureteral stone with mild left
hydronephrosis.
2. Fatty liver.
